# Patient Record
Sex: Male | Born: 1940 | Race: White | Hispanic: No | Marital: Single | Smoking: Former smoker
Health system: Southern US, Community
[De-identification: ages and names within clinical notes are randomized; demographics above are authoritative.]

## PROBLEM LIST (undated history)

## (undated) DIAGNOSIS — I10 Essential (primary) hypertension: Secondary | ICD-10-CM

---

## 2012-09-16 ENCOUNTER — Encounter: Payer: Self-pay | Admitting: Cardiology

## 2018-01-29 DIAGNOSIS — E782 Mixed hyperlipidemia: Secondary | ICD-10-CM | POA: Diagnosis not present

## 2018-01-29 DIAGNOSIS — I10 Essential (primary) hypertension: Secondary | ICD-10-CM | POA: Diagnosis not present

## 2018-01-29 DIAGNOSIS — N400 Enlarged prostate without lower urinary tract symptoms: Secondary | ICD-10-CM | POA: Diagnosis not present

## 2018-04-30 DIAGNOSIS — E7849 Other hyperlipidemia: Secondary | ICD-10-CM | POA: Diagnosis not present

## 2018-04-30 DIAGNOSIS — N400 Enlarged prostate without lower urinary tract symptoms: Secondary | ICD-10-CM | POA: Diagnosis not present

## 2018-04-30 DIAGNOSIS — I10 Essential (primary) hypertension: Secondary | ICD-10-CM | POA: Diagnosis not present

## 2018-07-31 DIAGNOSIS — Z1389 Encounter for screening for other disorder: Secondary | ICD-10-CM | POA: Diagnosis not present

## 2018-07-31 DIAGNOSIS — E7849 Other hyperlipidemia: Secondary | ICD-10-CM | POA: Diagnosis not present

## 2018-07-31 DIAGNOSIS — I10 Essential (primary) hypertension: Secondary | ICD-10-CM | POA: Diagnosis not present

## 2018-07-31 DIAGNOSIS — N400 Enlarged prostate without lower urinary tract symptoms: Secondary | ICD-10-CM | POA: Diagnosis not present

## 2018-07-31 DIAGNOSIS — Z0000 Encounter for general adult medical examination without abnormal findings: Secondary | ICD-10-CM | POA: Diagnosis not present

## 2019-01-22 DIAGNOSIS — E7849 Other hyperlipidemia: Secondary | ICD-10-CM | POA: Diagnosis not present

## 2019-01-22 DIAGNOSIS — Z681 Body mass index (BMI) 19 or less, adult: Secondary | ICD-10-CM | POA: Diagnosis not present

## 2019-01-22 DIAGNOSIS — N400 Enlarged prostate without lower urinary tract symptoms: Secondary | ICD-10-CM | POA: Diagnosis not present

## 2019-01-22 DIAGNOSIS — J449 Chronic obstructive pulmonary disease, unspecified: Secondary | ICD-10-CM | POA: Diagnosis not present

## 2019-01-22 DIAGNOSIS — I10 Essential (primary) hypertension: Secondary | ICD-10-CM | POA: Diagnosis not present

## 2019-06-01 DIAGNOSIS — I10 Essential (primary) hypertension: Secondary | ICD-10-CM | POA: Diagnosis not present

## 2019-06-01 DIAGNOSIS — Z681 Body mass index (BMI) 19 or less, adult: Secondary | ICD-10-CM | POA: Diagnosis not present

## 2019-06-01 DIAGNOSIS — N400 Enlarged prostate without lower urinary tract symptoms: Secondary | ICD-10-CM | POA: Diagnosis not present

## 2019-06-01 DIAGNOSIS — E7849 Other hyperlipidemia: Secondary | ICD-10-CM | POA: Diagnosis not present

## 2019-06-01 DIAGNOSIS — J449 Chronic obstructive pulmonary disease, unspecified: Secondary | ICD-10-CM | POA: Diagnosis not present

## 2019-08-28 ENCOUNTER — Inpatient Hospital Stay (HOSPITAL_COMMUNITY)
Admission: EM | Admit: 2019-08-28 | Discharge: 2019-09-03 | DRG: 242 | Disposition: A | Discharge status: Home / Self Care | Payer: Medicare HMO | Attending: Internal Medicine | Admitting: Internal Medicine

## 2019-08-28 DIAGNOSIS — R21 Rash and other nonspecific skin eruption: Secondary | ICD-10-CM | POA: Diagnosis present

## 2019-08-28 DIAGNOSIS — Z515 Encounter for palliative care: Secondary | ICD-10-CM | POA: Diagnosis present

## 2019-08-28 DIAGNOSIS — I350 Nonrheumatic aortic (valve) stenosis: Secondary | ICD-10-CM | POA: Diagnosis present

## 2019-08-28 DIAGNOSIS — N320 Bladder-neck obstruction: Secondary | ICD-10-CM | POA: Diagnosis present

## 2019-08-28 DIAGNOSIS — I10 Essential (primary) hypertension: Secondary | ICD-10-CM

## 2019-08-28 DIAGNOSIS — D649 Anemia, unspecified: Secondary | ICD-10-CM

## 2019-08-28 DIAGNOSIS — J80 Acute respiratory distress syndrome: Secondary | ICD-10-CM | POA: Diagnosis not present

## 2019-08-28 DIAGNOSIS — Z950 Presence of cardiac pacemaker: Secondary | ICD-10-CM

## 2019-08-28 DIAGNOSIS — I739 Peripheral vascular disease, unspecified: Secondary | ICD-10-CM | POA: Diagnosis present

## 2019-08-28 DIAGNOSIS — N400 Enlarged prostate without lower urinary tract symptoms: Secondary | ICD-10-CM | POA: Diagnosis present

## 2019-08-28 DIAGNOSIS — J939 Pneumothorax, unspecified: Secondary | ICD-10-CM

## 2019-08-28 DIAGNOSIS — I5031 Acute diastolic (congestive) heart failure: Secondary | ICD-10-CM

## 2019-08-28 DIAGNOSIS — R1310 Dysphagia, unspecified: Secondary | ICD-10-CM | POA: Diagnosis present

## 2019-08-28 DIAGNOSIS — R001 Bradycardia, unspecified: Secondary | ICD-10-CM | POA: Diagnosis present

## 2019-08-28 DIAGNOSIS — N289 Disorder of kidney and ureter, unspecified: Secondary | ICD-10-CM | POA: Diagnosis not present

## 2019-08-28 DIAGNOSIS — R7989 Other specified abnormal findings of blood chemistry: Secondary | ICD-10-CM

## 2019-08-28 DIAGNOSIS — R0689 Other abnormalities of breathing: Secondary | ICD-10-CM | POA: Diagnosis not present

## 2019-08-28 DIAGNOSIS — J449 Chronic obstructive pulmonary disease, unspecified: Secondary | ICD-10-CM | POA: Diagnosis present

## 2019-08-28 DIAGNOSIS — N19 Unspecified kidney failure: Secondary | ICD-10-CM

## 2019-08-28 DIAGNOSIS — I499 Cardiac arrhythmia, unspecified: Secondary | ICD-10-CM | POA: Diagnosis not present

## 2019-08-28 DIAGNOSIS — N179 Acute kidney failure, unspecified: Secondary | ICD-10-CM | POA: Diagnosis present

## 2019-08-28 DIAGNOSIS — R55 Syncope and collapse: Secondary | ICD-10-CM | POA: Diagnosis not present

## 2019-08-28 DIAGNOSIS — R069 Unspecified abnormalities of breathing: Secondary | ICD-10-CM | POA: Diagnosis not present

## 2019-08-28 DIAGNOSIS — R569 Unspecified convulsions: Secondary | ICD-10-CM | POA: Diagnosis present

## 2019-08-28 DIAGNOSIS — E785 Hyperlipidemia, unspecified: Secondary | ICD-10-CM | POA: Diagnosis present

## 2019-08-28 DIAGNOSIS — R778 Other specified abnormalities of plasma proteins: Secondary | ICD-10-CM

## 2019-08-28 DIAGNOSIS — J9601 Acute respiratory failure with hypoxia: Secondary | ICD-10-CM

## 2019-08-28 DIAGNOSIS — Z681 Body mass index (BMI) 19 or less, adult: Secondary | ICD-10-CM

## 2019-08-28 DIAGNOSIS — Z8249 Family history of ischemic heart disease and other diseases of the circulatory system: Secondary | ICD-10-CM

## 2019-08-28 DIAGNOSIS — I110 Hypertensive heart disease with heart failure: Secondary | ICD-10-CM | POA: Diagnosis present

## 2019-08-28 DIAGNOSIS — F17210 Nicotine dependence, cigarettes, uncomplicated: Secondary | ICD-10-CM | POA: Diagnosis present

## 2019-08-28 DIAGNOSIS — B029 Zoster without complications: Secondary | ICD-10-CM | POA: Diagnosis present

## 2019-08-28 DIAGNOSIS — I4510 Unspecified right bundle-branch block: Secondary | ICD-10-CM | POA: Diagnosis present

## 2019-08-28 DIAGNOSIS — Z79899 Other long term (current) drug therapy: Secondary | ICD-10-CM

## 2019-08-28 DIAGNOSIS — N281 Cyst of kidney, acquired: Secondary | ICD-10-CM | POA: Diagnosis present

## 2019-08-28 DIAGNOSIS — E43 Unspecified severe protein-calorie malnutrition: Secondary | ICD-10-CM | POA: Diagnosis present

## 2019-08-28 DIAGNOSIS — I442 Atrioventricular block, complete: Secondary | ICD-10-CM

## 2019-08-28 DIAGNOSIS — R0600 Dyspnea, unspecified: Secondary | ICD-10-CM | POA: Diagnosis not present

## 2019-08-28 DIAGNOSIS — Z20828 Contact with and (suspected) exposure to other viral communicable diseases: Secondary | ICD-10-CM | POA: Diagnosis present

## 2019-08-28 DIAGNOSIS — I509 Heart failure, unspecified: Secondary | ICD-10-CM

## 2019-08-28 DIAGNOSIS — D72829 Elevated white blood cell count, unspecified: Secondary | ICD-10-CM | POA: Diagnosis present

## 2019-08-28 DIAGNOSIS — I248 Other forms of acute ischemic heart disease: Secondary | ICD-10-CM | POA: Diagnosis present

## 2019-08-28 DIAGNOSIS — I252 Old myocardial infarction: Secondary | ICD-10-CM

## 2019-08-28 DIAGNOSIS — I471 Supraventricular tachycardia: Secondary | ICD-10-CM | POA: Diagnosis present

## 2019-08-28 HISTORY — DX: Essential (primary) hypertension: I10

## 2019-08-28 NOTE — ED Triage Notes (Addendum)
Pt BIB EMS from Home SOB for past week and has been recently more SOB. Upon arrival EMS did EKG and saw possible 3rd Degree Heart Block.   VSS with EMS except HR in 40s.   Currently SOB with NRB 15L (for comfort per EMS)  Pt states no cardiac Hx but Metoprolol and other cardiac medications were found at bedside.

## 2019-08-28 NOTE — ED Notes (Signed)
ED Provider notified of patients condition.

## 2019-08-29 ENCOUNTER — Inpatient Hospital Stay (HOSPITAL_COMMUNITY): Payer: Medicare HMO

## 2019-08-29 ENCOUNTER — Emergency Department (HOSPITAL_COMMUNITY): Payer: Medicare HMO

## 2019-08-29 ENCOUNTER — Encounter (HOSPITAL_COMMUNITY)
Admission: EM | Disposition: A | Discharge status: Home / Self Care | Payer: Self-pay | Source: Home / Self Care | Attending: Internal Medicine

## 2019-08-29 DIAGNOSIS — J9601 Acute respiratory failure with hypoxia: Secondary | ICD-10-CM

## 2019-08-29 DIAGNOSIS — N179 Acute kidney failure, unspecified: Secondary | ICD-10-CM | POA: Diagnosis not present

## 2019-08-29 DIAGNOSIS — Z681 Body mass index (BMI) 19 or less, adult: Secondary | ICD-10-CM | POA: Diagnosis not present

## 2019-08-29 DIAGNOSIS — B029 Zoster without complications: Secondary | ICD-10-CM | POA: Diagnosis present

## 2019-08-29 DIAGNOSIS — R569 Unspecified convulsions: Secondary | ICD-10-CM

## 2019-08-29 DIAGNOSIS — R001 Bradycardia, unspecified: Secondary | ICD-10-CM | POA: Diagnosis present

## 2019-08-29 DIAGNOSIS — R1310 Dysphagia, unspecified: Secondary | ICD-10-CM | POA: Diagnosis present

## 2019-08-29 DIAGNOSIS — R55 Syncope and collapse: Secondary | ICD-10-CM | POA: Diagnosis not present

## 2019-08-29 DIAGNOSIS — I442 Atrioventricular block, complete: Secondary | ICD-10-CM | POA: Diagnosis not present

## 2019-08-29 DIAGNOSIS — R778 Other specified abnormalities of plasma proteins: Secondary | ICD-10-CM | POA: Diagnosis not present

## 2019-08-29 DIAGNOSIS — E43 Unspecified severe protein-calorie malnutrition: Secondary | ICD-10-CM | POA: Diagnosis not present

## 2019-08-29 DIAGNOSIS — I350 Nonrheumatic aortic (valve) stenosis: Secondary | ICD-10-CM | POA: Diagnosis present

## 2019-08-29 DIAGNOSIS — Z515 Encounter for palliative care: Secondary | ICD-10-CM | POA: Diagnosis not present

## 2019-08-29 DIAGNOSIS — I10 Essential (primary) hypertension: Secondary | ICD-10-CM | POA: Diagnosis not present

## 2019-08-29 DIAGNOSIS — I509 Heart failure, unspecified: Secondary | ICD-10-CM | POA: Diagnosis not present

## 2019-08-29 DIAGNOSIS — R9431 Abnormal electrocardiogram [ECG] [EKG]: Secondary | ICD-10-CM | POA: Diagnosis not present

## 2019-08-29 DIAGNOSIS — Z7189 Other specified counseling: Secondary | ICD-10-CM | POA: Diagnosis not present

## 2019-08-29 DIAGNOSIS — F17210 Nicotine dependence, cigarettes, uncomplicated: Secondary | ICD-10-CM | POA: Diagnosis present

## 2019-08-29 DIAGNOSIS — R7989 Other specified abnormal findings of blood chemistry: Secondary | ICD-10-CM | POA: Diagnosis not present

## 2019-08-29 DIAGNOSIS — N281 Cyst of kidney, acquired: Secondary | ICD-10-CM | POA: Diagnosis not present

## 2019-08-29 DIAGNOSIS — D649 Anemia, unspecified: Secondary | ICD-10-CM

## 2019-08-29 DIAGNOSIS — I110 Hypertensive heart disease with heart failure: Secondary | ICD-10-CM | POA: Diagnosis present

## 2019-08-29 DIAGNOSIS — J90 Pleural effusion, not elsewhere classified: Secondary | ICD-10-CM | POA: Diagnosis not present

## 2019-08-29 DIAGNOSIS — J449 Chronic obstructive pulmonary disease, unspecified: Secondary | ICD-10-CM | POA: Diagnosis not present

## 2019-08-29 DIAGNOSIS — Z20828 Contact with and (suspected) exposure to other viral communicable diseases: Secondary | ICD-10-CM | POA: Diagnosis not present

## 2019-08-29 DIAGNOSIS — Z950 Presence of cardiac pacemaker: Secondary | ICD-10-CM | POA: Diagnosis not present

## 2019-08-29 DIAGNOSIS — I5031 Acute diastolic (congestive) heart failure: Secondary | ICD-10-CM | POA: Diagnosis not present

## 2019-08-29 DIAGNOSIS — I739 Peripheral vascular disease, unspecified: Secondary | ICD-10-CM | POA: Diagnosis not present

## 2019-08-29 DIAGNOSIS — I248 Other forms of acute ischemic heart disease: Secondary | ICD-10-CM | POA: Diagnosis not present

## 2019-08-29 DIAGNOSIS — E785 Hyperlipidemia, unspecified: Secondary | ICD-10-CM | POA: Diagnosis present

## 2019-08-29 DIAGNOSIS — N289 Disorder of kidney and ureter, unspecified: Secondary | ICD-10-CM | POA: Diagnosis not present

## 2019-08-29 DIAGNOSIS — I4510 Unspecified right bundle-branch block: Secondary | ICD-10-CM | POA: Diagnosis present

## 2019-08-29 DIAGNOSIS — R0602 Shortness of breath: Secondary | ICD-10-CM | POA: Diagnosis not present

## 2019-08-29 DIAGNOSIS — D72829 Elevated white blood cell count, unspecified: Secondary | ICD-10-CM | POA: Diagnosis present

## 2019-08-29 HISTORY — PX: TEMPORARY PACEMAKER: CATH118268

## 2019-08-29 LAB — ECHOCARDIOGRAM COMPLETE
Height: 71 in
Weight: 1964.74 oz

## 2019-08-29 LAB — CBC WITH DIFFERENTIAL/PLATELET
Abs Immature Granulocytes: 0.03 10*3/uL (ref 0.00–0.07)
Basophils Absolute: 0 10*3/uL (ref 0.0–0.1)
Basophils Relative: 0 %
Eosinophils Absolute: 0 10*3/uL (ref 0.0–0.5)
Eosinophils Relative: 0 %
HCT: 38.7 % — ABNORMAL LOW (ref 39.0–52.0)
Hemoglobin: 12.5 g/dL — ABNORMAL LOW (ref 13.0–17.0)
Immature Granulocytes: 0 %
Lymphocytes Relative: 5 %
Lymphs Abs: 0.4 10*3/uL — ABNORMAL LOW (ref 0.7–4.0)
MCH: 30.7 pg (ref 26.0–34.0)
MCHC: 32.3 g/dL (ref 30.0–36.0)
MCV: 95.1 fL (ref 80.0–100.0)
Monocytes Absolute: 0.4 10*3/uL (ref 0.1–1.0)
Monocytes Relative: 4 %
Neutro Abs: 7.8 10*3/uL — ABNORMAL HIGH (ref 1.7–7.7)
Neutrophils Relative %: 91 %
Platelets: 200 10*3/uL (ref 150–400)
RBC: 4.07 MIL/uL — ABNORMAL LOW (ref 4.22–5.81)
RDW: 14.4 % (ref 11.5–15.5)
WBC: 8.6 10*3/uL (ref 4.0–10.5)
nRBC: 0 % (ref 0.0–0.2)

## 2019-08-29 LAB — TROPONIN I (HIGH SENSITIVITY)
Troponin I (High Sensitivity): 869 ng/L (ref <18)
Troponin I (High Sensitivity): 778 ng/L (ref <18)
Troponin I (High Sensitivity): 1306 ng/L (ref <18)
Troponin I (High Sensitivity): 1583 ng/L (ref <18)

## 2019-08-29 LAB — BASIC METABOLIC PANEL
Anion gap: 12 (ref 5–15)
BUN: 45 mg/dL — ABNORMAL HIGH (ref 8–23)
CO2: 19 mmol/L — ABNORMAL LOW (ref 22–32)
Calcium: 8.9 mg/dL (ref 8.9–10.3)
Chloride: 104 mmol/L (ref 98–111)
Creatinine, Ser: 2.82 mg/dL — ABNORMAL HIGH (ref 0.61–1.24)
GFR calc Af Amer: 24 mL/min — ABNORMAL LOW (ref >60)
GFR calc non Af Amer: 20 mL/min — ABNORMAL LOW (ref >60)
Glucose, Bld: 145 mg/dL — ABNORMAL HIGH (ref 70–99)
Potassium: 4.8 mmol/L (ref 3.5–5.1)
Sodium: 135 mmol/L (ref 135–145)

## 2019-08-29 LAB — BRAIN NATRIURETIC PEPTIDE: B Natriuretic Peptide: 1035.9 pg/mL — ABNORMAL HIGH (ref 0.0–100.0)

## 2019-08-29 LAB — MAGNESIUM: Magnesium: 1.9 mg/dL (ref 1.7–2.4)

## 2019-08-29 LAB — POCT I-STAT 7, (LYTES, BLD GAS, ICA,H+H)
Acid-base deficit: 7 mmol/L — ABNORMAL HIGH (ref 0.0–2.0)
Bicarbonate: 17.3 mmol/L — ABNORMAL LOW (ref 20.0–28.0)
Calcium, Ion: 1.15 mmol/L (ref 1.15–1.40)
HCT: 33 % — ABNORMAL LOW (ref 39.0–52.0)
Hemoglobin: 11.2 g/dL — ABNORMAL LOW (ref 13.0–17.0)
O2 Saturation: 100 %
Patient temperature: 98.1
Potassium: 5 mmol/L (ref 3.5–5.1)
Sodium: 137 mmol/L (ref 135–145)
TCO2: 18 mmol/L — ABNORMAL LOW (ref 22–32)
pCO2 arterial: 29.3 mmHg — ABNORMAL LOW (ref 32.0–48.0)
pH, Arterial: 7.376 (ref 7.350–7.450)
pO2, Arterial: 236 mmHg — ABNORMAL HIGH (ref 83.0–108.0)

## 2019-08-29 LAB — SARS CORONAVIRUS 2 (TAT 6-24 HRS): SARS Coronavirus 2: NEGATIVE

## 2019-08-29 SURGERY — TEMPORARY PACEMAKER
Anesthesia: LOCAL

## 2019-08-29 MED ORDER — ATROPINE SULFATE 1 MG/10ML IJ SOSY
1.0000 mg | PREFILLED_SYRINGE | Freq: Once | INTRAMUSCULAR | Status: AC
  Administered 2019-08-29: 1 mg via INTRAVENOUS
  Filled 2019-08-29: qty 10

## 2019-08-29 MED ORDER — ENOXAPARIN SODIUM 60 MG/0.6ML ~~LOC~~ SOLN
1.0000 mg/kg | Freq: Once | SUBCUTANEOUS | Status: AC
  Administered 2019-08-29: 55 mg via SUBCUTANEOUS
  Filled 2019-08-29: qty 0.6

## 2019-08-29 MED ORDER — NON FORMULARY: 10.0000 mg | Freq: Every evening | Status: DC | PRN

## 2019-08-29 MED ORDER — SODIUM CHLORIDE 0.9 % IV SOLN
250.0000 mL | INTRAVENOUS | Status: DC | PRN
  Administered 2019-08-29: 250 mL via INTRAVENOUS

## 2019-08-29 MED ORDER — SODIUM CHLORIDE 0.9% FLUSH
3.0000 mL | Freq: Two times a day (BID) | INTRAVENOUS | Status: DC
  Administered 2019-08-29 (×2): 3 mL via INTRAVENOUS

## 2019-08-29 MED ORDER — MELATONIN 3 MG PO TABS
9.0000 mg | ORAL_TABLET | Freq: Every evening | ORAL | Status: DC | PRN
  Administered 2019-08-29: 9 mg via ORAL
  Filled 2019-08-29 (×3): qty 3

## 2019-08-29 MED ORDER — SODIUM CHLORIDE 0.9 % IV SOLN
1.0000 g | INTRAVENOUS | Status: DC
  Administered 2019-08-29 – 2019-08-30 (×2): 1 g via INTRAVENOUS
  Filled 2019-08-29 (×2): qty 1

## 2019-08-29 MED ORDER — SODIUM CHLORIDE 0.9% FLUSH: 3.0000 mL | INTRAVENOUS | Status: DC | PRN

## 2019-08-29 MED ORDER — SODIUM CHLORIDE 0.9 % IV SOLN
250.0000 mL | INTRAVENOUS | Status: DC | PRN
  Administered 2019-08-30: 250 mL via INTRAVENOUS

## 2019-08-29 MED ORDER — HEPARIN (PORCINE) IN NACL 1000-0.9 UT/500ML-% IV SOLN
INTRAVENOUS | Status: DC | PRN
  Administered 2019-08-29: 500 mL

## 2019-08-29 MED ORDER — SODIUM CHLORIDE 0.9% FLUSH
3.0000 mL | Freq: Two times a day (BID) | INTRAVENOUS | Status: DC
  Administered 2019-08-29 – 2019-09-03 (×8): 3 mL via INTRAVENOUS

## 2019-08-29 MED ORDER — CHLORHEXIDINE GLUCONATE CLOTH 2 % EX PADS
6.0000 | MEDICATED_PAD | Freq: Every day | CUTANEOUS | Status: DC
  Administered 2019-08-30 – 2019-09-03 (×5): 6 via TOPICAL

## 2019-08-29 MED ORDER — FUROSEMIDE 10 MG/ML IJ SOLN
20.0000 mg | Freq: Once | INTRAMUSCULAR | Status: AC
  Administered 2019-08-29: 20 mg via INTRAVENOUS
  Filled 2019-08-29: qty 2

## 2019-08-29 MED ORDER — LEVETIRACETAM IN NACL 500 MG/100ML IV SOLN
500.0000 mg | Freq: Two times a day (BID) | INTRAVENOUS | Status: DC
  Administered 2019-08-29 – 2019-08-31 (×4): 500 mg via INTRAVENOUS
  Filled 2019-08-29 (×4): qty 100

## 2019-08-29 MED ORDER — LIDOCAINE HCL (PF) 1 % IJ SOLN
INTRAMUSCULAR | Status: DC | PRN
  Administered 2019-08-29: 15 mL

## 2019-08-29 MED ORDER — HEPARIN (PORCINE) IN NACL 1000-0.9 UT/500ML-% IV SOLN
INTRAVENOUS | Status: AC
  Filled 2019-08-29: qty 500

## 2019-08-29 MED ORDER — SODIUM CHLORIDE 0.9 % IV SOLN
INTRAVENOUS | Status: AC | PRN
  Administered 2019-08-29: 10 mL/h via INTRAVENOUS

## 2019-08-29 MED ORDER — LIDOCAINE HCL (PF) 1 % IJ SOLN
INTRAMUSCULAR | Status: AC
  Filled 2019-08-29: qty 30

## 2019-08-29 SURGICAL SUPPLY — 8 items
CABLE ADAPT PACING TEMP 12FT (ADAPTER) ×2 IMPLANT
CATH S G BIP PACING (CATHETERS) ×2 IMPLANT
PACK CARDIAC CATHETERIZATION (CUSTOM PROCEDURE TRAY) ×2 IMPLANT
PINNACLE LONG 6F 25CM (SHEATH) ×2
SHEATH INTRO PINNACLE 6F 25CM (SHEATH) ×1 IMPLANT
SHEATH PINNACLE 6F 10CM (SHEATH) ×2 IMPLANT
SLEEVE REPOSITIONING LENGTH 30 (MISCELLANEOUS) ×2 IMPLANT
WIRE EMERALD 3MM-J .035X150CM (WIRE) ×2 IMPLANT

## 2019-08-29 NOTE — Progress Notes (Signed)
Home Meds per spouse: Flomax 0.4 mg qday Atorvastatin 20 mg q hs  Amlodipine 5 mg qday Metoprolol Tartrate 12.5 mg q am  Vit D3 1000 iu daily

## 2019-08-29 NOTE — Progress Notes (Signed)
Orthopedic Tech Progress Note Patient Details:  Kenneth Matthews [birth year] [id number]  Ortho Devices Type of Ortho Device: Knee Immobilizer Ortho Device/Splint Location: right Ortho Device/Splint Interventions: Application   Post Interventions Patient Tolerated: Well Instructions Provided: Care of device   Kenneth Matthews 08/29/2019, 12:51 PM

## 2019-08-29 NOTE — ED Notes (Signed)
Provider notified of elevated troponin.  

## 2019-08-29 NOTE — ED Provider Notes (Signed)
[facility] EMERGENCY DEPARTMENT Provider Note   CSN: [id number] Arrival date & time: 08/28/19  2312    History   Chief Complaint Chief Complaint  Patient presents with  . Heart Block    HPI Kenneth Matthews is a 78 y.o. male.   The history is provided by the patient and the EMS personnel.  He comes in because of shortness of breath and generalized weakness for the last week.  Dyspnea seems to be worse when he is supine, but also with any exertion.  He denies chest pain, heaviness, tightness, pressure.  He denies nausea or vomiting.  He denies palpitations.  EMS noted third-degree heart block.  On review of his medications, he is on metoprolol and amlodipine, but relatively low doses of both of those.  No past medical history on file.  There are no active problems to display for this patient.   ** The histories are not reviewed yet. Please review them in the "History" navigator section and refresh this [place].      Home Medications    Prior to Admission medications   Not on File    Family History No family history on file.  Social History Social History   Tobacco Use  . Smoking status: Not on file  Substance Use Topics  . Alcohol use: Not on file  . Drug use: Not on file     Allergies   Patient has no allergy information on record.   Review of Systems Review of Systems  All other systems reviewed and are negative.    Physical Exam Updated Vital Signs BP (!) 124/47   Pulse (!) 137   Temp 98.1 F (36.7 C) (Oral)   Resp (!) 39   SpO2 100%   Physical Exam Vitals signs and nursing note reviewed.    78 year old male, appears mildly dyspneic at rest, but is in no acute distress. Vital signs are significant for slow heart rate and elevated respiratory rate. Oxygen saturation is 100%, which is normal. Head is normocephalic and atraumatic. PERRLA, EOMI. Oropharynx is clear. Neck is nontender and supple without adenopathy or  JVD. Back is nontender and there is no CVA tenderness. Lungs have few rales at the left base without wheezes or rhonchi. Chest is nontender. Heart has regular rate and rhythm without murmur. Abdomen is soft, flat, nontender without masses or hepatosplenomegaly and peristalsis is normoactive. Extremities have no cyanosis or edema, full range of motion is present. Skin is warm and dry without rash. Neurologic: Mental status is normal, cranial nerves are intact, there are no motor or sensory deficits.  ED Treatments / Results  Labs (all labs ordered are listed, but only abnormal results are displayed) Labs Reviewed  SARS CORONAVIRUS 2 (TAT 6-24 HRS)  BASIC METABOLIC PANEL  BRAIN NATRIURETIC PEPTIDE  CBC WITH DIFFERENTIAL/PLATELET  MAGNESIUM  TROPONIN I (HIGH SENSITIVITY)    EKG Third-degree AV block with ventricular rate of 44.  Right bundle branch block.  Left ventricular hypertrophy.  ST and T changes possibly secondary to LVH.  No prior ECG available for comparison.  Radiology Dg Chest Port 1 View  Result Date: 08/29/2019 CLINICAL DATA:  Shortness of breath for 1 week EXAM: PORTABLE CHEST 1 VIEW COMPARISON:  None. FINDINGS: Cardiac shadow is at the upper limits of normal in size. Aortic calcifications are noted. Calcified pleural plaques are noted. Small right pleural effusion is seen. Right basilar atelectatic changes are noted. No bony abnormality is seen. IMPRESSION: Right  basilar atelectasis with small effusion. Bilateral calcified pleural plaques. Electronically Signed   By: Alcide Clever M.D.   On: 08/29/2019 00:21    Procedures Procedures  CRITICAL CARE Performed by: Dione Booze Total critical care time: 50 minutes Critical care time was exclusive of separately billable procedures and treating other patients. Critical care was necessary to treat or prevent imminent or life-threatening deterioration. Critical care was time spent personally by me on the following  activities: development of treatment plan with patient and/or surrogate as well as nursing, discussions with consultants, evaluation of patient's response to treatment, examination of patient, obtaining history from patient or surrogate, ordering and performing treatments and interventions, ordering and review of laboratory studies, ordering and review of radiographic studies, pulse oximetry and re-evaluation of patient's condition.  Medications Ordered in ED Medications  atropine 1 MG/10ML injection 1 mg (has no administration in time range)     Initial Impression / Assessment and Plan / ED Course  I have reviewed the triage vital signs and the nursing notes.  Pertinent labs & imaging results that were available during my care of the patient were reviewed by me and considered in my medical decision making (see chart for details).  Dyspnea and weakness.  Monitor does show complete heart block with atrial rate of 115 and ventricular rate of 48.  While this might be from sick sinus syndrome, most likely cause is beta-blocker use with possible exacerbation secondary to calcium channel blocker use.  He is maintaining adequate blood pressures.  He will be given a dose of atropine.  He will need to be admitted and kept on cardiac monitor well amlodipine and metoprolol clear his system.  If he does not improve with that, he may need a pacemaker.  Old records are reviewed, and he has no relevant past visits.  There was no change in heart rhythm following atropine.  Twelve-lead ECG shows third-degree AV block with ventricular rate of 44, right bundle branch block, T wave inversions in the anteroseptal leads with no prior ECG available for comparison.  Labs show renal insufficiency, anemia which is probably related to renal failure, elevated BNP and elevated troponin which are at least partly related to renal failure.  With no chest pain and no ECG findings, I do not feel that anticoagulation is indicated.  No  prior labs are available for comparison.  Chest x-ray does not show any obvious signs of heart failure.  Case is discussed with Dr. Nelda Bucks of [facility], who agrees to admit the patient.  Final Clinical Impressions(s) / ED Diagnoses   Final diagnoses:  Third degree heart block (HCC)  Renal insufficiency  Normochromic normocytic anemia  Elevated troponin I level  Elevated brain natriuretic peptide (BNP) level    ED Discharge Orders    None       Dione Booze, MD 08/29/19 [id number]

## 2019-08-29 NOTE — Procedures (Signed)
Patient Name: Kenneth Matthews  MRN: [id number]  Epilepsy Attending: Lora Havens  Referring Physician/Provider: Dr Karena Addison Aroor Date: 08/29/2019 Duration: 23.05 mins  Patient history: 78yo M with seizure vs syncope. EEG to evaluate for seizure  Level of alertness: awake  AEDs during EEG study: LEV  Technical aspects: This EEG study was done with scalp electrodes positioned according to the 10-20 International system of electrode placement. Electrical activity was acquired at a sampling rate of 500Hz  and reviewed with a high frequency filter of 70Hz  and a low frequency filter of 1Hz . EEG data were recorded continuously and digitally stored.   DESCRIPTION:  The posterior dominant rhythm consists of 8-9 Hz activity of moderate voltage (25-35 uV) seen predominantly in posterior head regions, symmetric and reactive to eye opening and eye closing. Hyperventilation and photic stimulation were not performed.  IMPRESSION: This study is within normal limits. No seizures or epileptiform discharges were seen throughout the recording.  [name] Barbra Sarks

## 2019-08-29 NOTE — Progress Notes (Signed)
Pt in CHB with HR in 40's, but more slower beats down to 26 bpm. BP stable. Dr Marlou Porch into room. Plan to insert temp pacer in cath lab per Dr Marlou Porch. 2H bed requested. Pt's spouse notified of plan. Verbalized understanding.

## 2019-08-29 NOTE — Consult Note (Addendum)
NEURO HOSPITALIST CONSULT NOTE   Requesting physician: Dr. Kurtis Bushman   Reason for Consult:seizure   History obtained from:  Patient  / family   HPI:                                                                                                                                          Kenneth Matthews is an 78 y.o. male  With [place]  MI N-STEMI ( 2013), COPD, HTN, PVD who presented [facility] ED on 08/28/2019 c/o SOB and generalized weakness for about 1 week. Neurology consulted 08/29/2019 seizure.    Patient c/o generalized weakness and SOB. Ems noted patient was in 3rd degree heart block. Patient was noted to have several seizure like activity on the way to pacemaker procedure and in the cath lab. Per family member patient has had at least 2 episodes of seizure like activity prior to hospitalization  She states that his eyes roll to the back.  His body stiffens for about 8- 10 seconds and then he returns to normal. These episodes happened today prior to pacemaker and a few times during pacemaker procedure. Has never been on seizure medication or had an EEG before.     Fam hx  heart disease ( mother)  Social hx Current smoker ( 1/2 pack per day) No ETOH Dugs: no  No Known Allergies  MEDICATIONS:                                                                                                                     Scheduled: . Chlorhexidine Gluconate Cloth  6 each Topical Daily  . sodium chloride flush  3 mL Intravenous Q12H  . sodium chloride flush  3 mL Intravenous Q12H   Continuous: . sodium chloride    . sodium chloride    . cefTRIAXone (ROCEPHIN)  IV Stopped (08/29/19 0905)   LOV:FIEPPI chloride, sodium chloride, sodium chloride flush, sodium chloride flush   ROS:  ROS was performed and is negative except as noted in  HPI    Blood pressure (!) 158/66, pulse (!) 0, temperature 97.6 F (36.4 C), temperature source Oral, resp. rate (!) 46, height 5\' 11"  (1.803 m), weight 55.7 kg, SpO2 94 %.   General Examination:                                                                                                       Physical Exam  Constitutional: Appears well-developed and well-nourished.  Psych: Affect appropriate to situation Eyes: Normal external eye and conjunctiva. HENT: Normocephalic, no lesions, without obvious abnormality.   Musculoskeletal-no joint tenderness, deformity or swelling Cardiovascular: Normal rate and regular rhythm.  Respiratory: Effort normal, non-labored breathing saturations WNL GI: Soft.  No distension. There is no tenderness.  Skin: WDI  Neurological Examination Mental Status: Alert, oriented, thought content appropriate.  Speech fluent without evidence of aphasia.  Able to follow commands without difficulty. Cranial Nerves: II: Visual fields grossly normal,  III,IV, VI: ptosis not present, extra-ocular motions intact bilaterally pupils equal, round, reactive to light and accommodation V,VII: smile symmetric, facial light touch sensation normal bilaterally VIII: hearing normal bilaterally IX,X: uvula rises symmetrically XI: bilateral shoulder shrug XII: midline tongue extension Motor: Right : Upper extremity   5/5  Left:     Upper extremity   5/5  Lower extremity   5/5   Lower extremity   5/5 Tone and bulk:normal tone throughout; no atrophy noted Sensory: light touch intact throughout, bilaterally Deep Tendon Reflexes: 2+ in left patella, right leg in long leg brace. 1+ BUE Plantars: Right: downgoing   Left: downgoing Gait: deferred   Lab Results: Basic Metabolic Panel: Recent Labs  Lab 08/28/19 2317 08/29/19 0433  NA 135 137  K 4.8 5.0  CL 104  --   CO2 19*  --   GLUCOSE 145*  --   BUN 45*  --   CREATININE 2.82*  --   CALCIUM 8.9  --   MG 1.9  --      CBC: Recent Labs  Lab 08/28/19 2317 08/29/19 0433  WBC 8.6  --   NEUTROABS 7.8*  --   HGB 12.5* 11.2*  HCT 38.7* 33.0*  MCV 95.1  --   PLT 200  --    Imaging: Dg Chest Port 1 View  Result Date: 08/29/2019 CLINICAL DATA:  Shortness of breath for 1 week EXAM: PORTABLE CHEST 1 VIEW COMPARISON:  None. FINDINGS: Cardiac shadow is at the upper limits of normal in size. Aortic calcifications are noted. Calcified pleural plaques are noted. Small right pleural effusion is seen. Right basilar atelectatic changes are noted. No bony abnormality is seen. IMPRESSION: Right basilar atelectasis with small effusion. Bilateral calcified pleural plaques. Electronically Signed   By: Alcide CleverMark  Lukens M.D.   On: 08/29/2019 00:21    Assessment:  Kenneth Matthews is an 78 y.o. male  With PMH  MI N-STEMI ( 2013), COPD, HTN, PVD who [facility] c/o SOB and generalized weakness for about 1 week. Neurology consulted  08/29/2019 seizure. Impression: Seizure vs convulsive syncope  Recommendations: --CTH  --EEG -- keppra 500 mg BID, however d/c if seizure like spells occurred while patient having heart block.    Valentina Lucks, MSN, NP-C [facility] [phone]  Attending neurologist's note to follow   08/29/2019, 1:04 PM  NEUROHOSPITALIST ADDENDUM Performed a face to face diagnostic evaluation.   I have reviewed the contents of history and physical exam as documented by PA/ARNP/Resident and agree with above documentation.  I have discussed and formulated the above plan as documented. Edits to the note have been made as needed.   Spell described by patient's wife was sudden onset unresponsiveness with up rolling of eyes and posturing, jerking of upper extremities lasting for less than 10 seconds with a fairly short postictal period. EEG is unremarkable CT head is also unremarkable to explain focus for seizure.  A normal CT head and routine EEG does not rule out  seizures.  However given how brief episodes were convulsive syncope is highly in the differential especially in a patient with arrhythmia.  Would recommend patient not to drive for 6 months another seizure precautions including no swimming, climbing heights.  Was started on Keppra during this hospital stay however if we can determine if this seizure-like spells occurred in the setting of bradycardia, will discontinue.    Georgiana Spinner Aroor MD [facility] [phone]   If 7pm to 7am, please call on call as listed on AMION.

## 2019-08-29 NOTE — Progress Notes (Signed)
Transferred via pt bed with 100% NRB mask at 10 L. Report given to [facility], RN. Pt's wife contacted via telephone to obtain consent for Temp pacer wire. Updated wife on pt's new location.

## 2019-08-29 NOTE — Progress Notes (Addendum)
Called per floor RN at 0900 regarding Pt admitted in complete heart block per ED MD notes. Per floor RN Pt HR admitted with rate in 40s, and has now reduced to 30s with new PVCs.  Upon my arrival Pt heart monitor yielding rate of 26 however on monitor it appears to be a rate of about 40-45 complete heart block. BP 126/50 RR 18-20 95-100 % on NRB. Pt not in respiratory distress at this time. Lungs diminished with some crackles in bilat bases. Pt awake, follows commands, moans but denies pain states "Im just feeling stiff" oriented to name and location. [facility] MD Dr. Kurtis Bushman paged per floor RN Marya Amsler and a cardiology consult is in process. Dr. Marlou Porch Cardiology to see Patient soon. EKG done at bedside appears to still be in complete heart block, RBBB rate 40s. Dr. Marlou Porch at bedside [phone] to assess Patient. AC and Patient Placement called, bed request in progress.  Per MD to transfer to CCU for Temp Pacemaker today. Marya Amsler RN updated and awaiting bed on 2 H for transfer.

## 2019-08-29 NOTE — Progress Notes (Signed)
*  PRELIMINARY RESULTS* Echocardiogram 2D Echocardiogram has been performed.  Kenneth Matthews 08/29/2019, 4:39 PM

## 2019-08-29 NOTE — Consult Note (Addendum)
Cardiology Consultation:   Patient ID: [name] MRN: [id number]; DOB: [birth year]  Admit date: 08/28/2019 Date of Consult: 08/29/2019  Primary Care Provider: Neale Burly, MD Primary Cardiologist: No primary care provider on file. New Primary Electrophysiologist:  None New (Dr. Curt Bears)   Patient Profile:   Kenneth Matthews is a 78 y.o. male with a hx of hypertension who is being seen today for the evaluation of complete heart block, severe symptomatic bradycardia/AV block at the request of Dr Kurtis Bushman.  History of Present Illness:   Mr. Buer 78 year old male with essential hypertension here with symptomatic bradycardia, AV block/third-degree heart block.   He has been complaining of shortness of breath over the last week and was worse yesterday evening.  No fevers chills cough.  Covid negative.  Heart rate originally was in the lower 40s.  On telemetry over the past few hours he is demonstrated transient heart rates in the mid 20s.  BNP 1035, troponin 1583    Heart Pathway Score:     No past medical history on file.  No prior significant cardiac history  Non-smoker  Home Medications:  Prior to Admission medications   Medication Sig Start Date End Date Taking? Authorizing Provider  albuterol (VENTOLIN HFA) 108 (90 Base) MCG/ACT inhaler Inhale 2 puffs into the lungs every 4 (four) hours as needed for wheezing or shortness of breath. 08/06/19   [provider]  amLODipine (NORVASC) 5 MG tablet Take 5 mg by mouth daily. 08/24/19   [provider]  atorvastatin (LIPITOR) 20 MG tablet Take 20 mg by mouth at bedtime. 07/06/19   [provider]  metoprolol tartrate (LOPRESSOR) 25 MG tablet Take 12.5 mg by mouth every morning. 07/13/19   [provider]  tamsulosin (FLOMAX) 0.4 MG CAPS capsule Take 0.4 mg by mouth daily. 08/24/19   [provider]    Inpatient Medications: Scheduled Meds: . sodium chloride flush  3 mL Intravenous  Q12H   Continuous Infusions: . sodium chloride    . cefTRIAXone (ROCEPHIN)  IV 1 g (08/29/19 0829)   PRN Meds: sodium chloride, sodium chloride flush  Allergies:   No Known Allergies  Social History:   Social History   Socioeconomic History  . Marital status: Single    Spouse name: Not on file  . Number of children: Not on file  . Years of education: Not on file  . Highest education level: Not on file  Occupational History  . Not on file  Social Needs  . Financial resource strain: Not on file  . Food insecurity    Worry: Not on file    Inability: Not on file  . Transportation needs    Medical: Not on file    Non-medical: Not on file  Tobacco Use  . Smoking status: Not on file  Substance and Sexual Activity  . Alcohol use: Not on file  . Drug use: Not on file  . Sexual activity: Not on file  Lifestyle  . Physical activity    Days per week: Not on file    Minutes per session: Not on file  . Stress: Not on file  Relationships  . Social Herbalist on phone: Not on file    Gets together: Not on file    Attends religious service: Not on file    Active member of club or organization: Not on file    Attends meetings of clubs or organizations: Not on file    Relationship  status: Not on file  . Intimate partner violence    Fear of current or ex partner: Not on file    Emotionally abused: Not on file    Physically abused: Not on file    Forced sexual activity: Not on file  Other Topics Concern  . Not on file  Social History Narrative  . Not on file    Family History:   No family history on file.  No early family history of coronary disease  ROS:  Please see the history of present illness.  Does not complain of any chest pain.  Currently short of breath.  Denies any fevers or chills. All other ROS reviewed and negative.     Physical Exam/Data:   Vitals:   08/29/19 0915 08/29/19 0930 08/29/19 0945 08/29/19 1000  BP: (!) 126/50 (!) 142/44 (!) 106/45  (!) 148/41  Pulse: (!) 31 (!) 131 (!) 36 (!) 57  Resp: 20 16 17 20   Temp:      TempSrc:      SpO2: 98% 99% 98% 98%  Weight:      Height:       No intake or output data in the 24 hours ending 08/29/19 1024 Last 3 Weights 08/29/2019  Weight (lbs) 122 lb 12.7 oz  Weight (kg) 55.7 kg     Body mass index is 17.13 kg/m.  General: Thin, alert with facemask oxygen HEENT: normal Lymph: no adenopathy Neck: no JVD Endocrine:  No thryomegaly Vascular: No carotid bruits; FA pulses 2+ bilaterally without bruits  Cardiac:  normal S1, S2; RRR; 2/6 systolic murmur  Lungs:  clear to auscultation bilaterally, no wheezing, rhonchi or rales  Abd: soft, nontender, no hepatomegaly  Ext: no edema Musculoskeletal:  No deformities, BUE and BLE strength normal and equal Skin: warm and dry  Neuro:  CNs 2-12 intact, no focal abnormalities noted Psych:  Normal affect   EKG:  The EKG was personally reviewed and demonstrates: Underlying sinus tachycardia with escape rhythm, heart rate 44 bpm  Telemetry:  Telemetry was personally reviewed and demonstrates: Transient decrease to 26 bpm, wide-complex escape.  Underlying sinus rhythm/sinus tachycardia 100-110.  Rare PVC noted  Relevant CV Studies: Echocardiogram 2013 demonstrated mild aortic stenosis, EF 65%.  Laboratory Data:  High Sensitivity Troponin:   Recent Labs  Lab 08/28/19 2317 08/29/19 0200 08/29/19 0529 08/29/19 0711  TROPONINIHS 869* 778* 1,306* 1,583*     Chemistry Recent Labs  Lab 08/28/19 2317 08/29/19 0433  NA 135 137  K 4.8 5.0  CL 104  --   CO2 19*  --   GLUCOSE 145*  --   BUN 45*  --   CREATININE 2.82*  --   CALCIUM 8.9  --   GFRNONAA 20*  --   GFRAA 24*  --   ANIONGAP 12  --     No results for input(s): PROT, ALBUMIN, AST, ALT, ALKPHOS, BILITOT in the last 168 hours. Hematology Recent Labs  Lab 08/28/19 2317 08/29/19 0433  WBC 8.6  --   RBC 4.07*  --   HGB 12.5* 11.2*  HCT 38.7* 33.0*  MCV 95.1  --   MCH  30.7  --   MCHC 32.3  --   RDW 14.4  --   PLT 200  --    BNP Recent Labs  Lab 08/28/19 2317  BNP 1,035.9*    DDimer No results for input(s): DDIMER in the last 168 hours.   Radiology/Studies:  Dg Chest Port 1 [address]View  Result Date: 08/29/2019 CLINICAL DATA:  Shortness of breath for 1 week EXAM: PORTABLE CHEST 1 VIEW COMPARISON:  None. FINDINGS: Cardiac shadow is at the upper limits of normal in size. Aortic calcifications are noted. Calcified pleural plaques are noted. Small right pleural effusion is seen. Right basilar atelectatic changes are noted. No bony abnormality is seen. IMPRESSION: Right basilar atelectasis with small effusion. Bilateral calcified pleural plaques. Electronically Signed   By: Alcide Clever M.D.   On: 08/29/2019 00:21    Assessment and Plan:   Complete heart block/AV block symptomatic -We will go ahead and place a temporary pacer wire since he is transiently decreasing his effective heart rate into the 20s.  Variable block is noted.  Dr. Elberta Fortis with electrophysiology is aware.  N.p.o. past midnight for permanent pacemaker tomorrow.  N.p.o. currently for temporary wire placement.  Discussed with Dr. Clifton James of interventional cardiology.  Call has been placed to [facility].  Essential hypertension -At home on low-dose amlodipine and low-dose metoprolol.  Tammy Sours, with nursing staff is currently trying to find the exact doses of these medications.  In fact we just got an update currently on the following medications: Flomax 0.4 mg qday Atorvastatin 20 mg q hs  Amlodipine 5 mg qday Metoprolol Tartrate 12.5 mg q am  Vit D3 1000 iu daily   Dose of metoprolol is very low.  Elevated troponin -Currently approximately 1500 high-sensitivity troponin.  This is likely in the setting of complete heart block, increased myocardial demand.  Not having any active anginal symptoms.  Portends a worsened prognosis.  Aortic stenosis -Previously described as mild in 2013.  Murmur  appreciated on exam.  We are repeating echocardiogram to see if severity has increased.  Acute kidney injury -Creatinine 2.8 BUN 45.  Likely in part from hypoperfusion secondary to decreased cardiac output in the setting of severe bradycardia.  Acute presumed diastolic heart failure -BNP 1000 -Checking echocardiogram.  Tried to call significant other listed in demographics.  Went straight to [employer].  CRITICAL CARE Performed by: Donato Schultz   Total critical care time: 60 minutes  Critical care time was exclusive of separately billable procedures and treating other patients.  Critical care was necessary to treat or prevent imminent or life-threatening deterioration.  Critical care was time spent personally by me on the following activities: development of treatment plan with patient and/or surrogate as well as nursing, discussions with consultants, evaluation of patient's response to treatment, examination of patient, obtaining history from patient or surrogate, ordering and performing treatments and interventions, ordering and review of laboratory studies, ordering and review of radiographic studies, pulse oximetry and re-evaluation of patient's condition.  For questions or updates, please contact [facility] Please consult [email or web address] for contact info under     Signed, Donato Schultz, MD  08/29/2019 10:24 AM

## 2019-08-29 NOTE — Progress Notes (Signed)
EEG complete - results pending 

## 2019-08-29 NOTE — H&P (Addendum)
History and Physical    Kenneth Matthews HYI:[id number] DOB: 11/01/1940 DOA: 08/28/2019  PCP: Neale Burly, MD    Patient coming from: Home    Chief Complaint: Shortness of breath for the last week worse tonight  HPI: Kenneth Matthews is a 78 y.o. male with medical history significant of hypertension on amlodipine and metoprolol apparently low-dose Per emergency room physician patient complaining of shortness of breath for the last week worse tonight.  EMS found him in complete AV block. Patient denies any chest pain fever chills productive cough  ED Course:  Patient on external pacemaker Electrocardiogram complete AV block Patient maintained blood pressure 114/70 heart rate 44 Complaining of shortness of breath on 100% nonrebreather saturation around 90% BNP 1035, troponin is 869 BUN 45 creatinine 2.8 Chest x-ray no obvious CHF No previous records to compare the blood work and chest x-ray  Review of Systems: As per HPI otherwise 10 point review of systems negative.  Except shortness of breath and weakness  No past medical history on file. Past medical history of hypertension  Past surgical history None   has no history on file for tobacco, alcohol, and drug.  No Known Allergies  No family history on file.   Prior to Admission medications   Not on File    Physical Exam: Vitals:   08/29/19 0230 08/29/19 0315 08/29/19 0345 08/29/19 0415  BP: (!) 109/47 138/73 (!) 117/48 (!) 127/49  Pulse: (!) 44 (!) 46 (!) 41 (!) 44  Resp: 17 (!) 33 19 17  Temp:      TempSrc:      SpO2: 100% 95% 98% 100%    Constitutional: NAD, calm, comfortable Vitals:   08/29/19 0230 08/29/19 0315 08/29/19 0345 08/29/19 0415  BP: (!) 109/47 138/73 (!) 117/48 (!) 127/49  Pulse: (!) 44 (!) 46 (!) 41 (!) 44  Resp: 17 (!) 33 19 17  Temp:      TempSrc:      SpO2: 100% 95% 98% 100%   Eyes: PERRL, lids and conjunctivae normal ENMT: Mucous membranes are moist. Posterior pharynx clear of  any exudate or lesions.Normal dentition.  Neck: normal, supple, no masses, no thyromegaly Respiratory: clear to auscultation bilaterally, no wheezing, no crackles. Normal respiratory effort. No accessory muscle use.  Cardiovascular: Regular rate and rhythm, no murmurs / rubs / gallops. No extremity edema. 2+ pedal pulses. No carotid bruits.  Abdomen: no tenderness, no masses palpated. No hepatosplenomegaly. Bowel sounds positive.  Musculoskeletal: no clubbing / cyanosis. No joint deformity upper and lower extremities. Good ROM, no contractures. Normal muscle tone.  Skin: no rashes, lesions, ulcers. No induration Neurologic: CN 2-12 grossly intact. Sensation intact, DTR normal. Strength 5/5 in all 4.  Psychiatric: Normal judgment and insight. Alert and oriented x 3. Normal mood.    Labs on Admission: I have personally reviewed following labs and imaging studies  CBC: Recent Labs  Lab 08/28/19 2317 08/29/19 0433  WBC 8.6  --   NEUTROABS 7.8*  --   HGB 12.5* 11.2*  HCT 38.7* 33.0*  MCV 95.1  --   PLT 200  --    Basic Metabolic Panel: Recent Labs  Lab 08/28/19 2317 08/29/19 0433  NA 135 137  K 4.8 5.0  CL 104  --   CO2 19*  --   GLUCOSE 145*  --   BUN 45*  --   CREATININE 2.82*  --   CALCIUM 8.9  --   MG 1.9  --  GFR: CrCl cannot be calculated (Unknown ideal weight.). Liver Function Tests: No results for input(s): AST, ALT, ALKPHOS, BILITOT, PROT, ALBUMIN in the last 168 hours. No results for input(s): LIPASE, AMYLASE in the last 168 hours. No results for input(s): AMMONIA in the last 168 hours. Coagulation Profile: No results for input(s): INR, PROTIME in the last 168 hours. Cardiac Enzymes: No results for input(s): CKTOTAL, CKMB, CKMBINDEX, TROPONINI in the last 168 hours. BNP (last 3 results) No results for input(s): PROBNP in the last 8760 hours. HbA1C: No results for input(s): HGBA1C in the last 72 hours. CBG: No results for input(s): GLUCAP in the last 168  hours. Lipid Profile: No results for input(s): CHOL, HDL, LDLCALC, TRIG, CHOLHDL, LDLDIRECT in the last 72 hours. Thyroid Function Tests: No results for input(s): TSH, T4TOTAL, FREET4, T3FREE, THYROIDAB in the last 72 hours. Anemia Panel: No results for input(s): VITAMINB12, FOLATE, FERRITIN, TIBC, IRON, RETICCTPCT in the last 72 hours. Urine analysis: No results found for: COLORURINE, APPEARANCEUR, LABSPEC, PHURINE, GLUCOSEU, HGBUR, BILIRUBINUR, KETONESUR, PROTEINUR, UROBILINOGEN, NITRITE, LEUKOCYTESUR  Radiological Exams on Admission: Dg Chest Port 1 View  Result Date: 08/29/2019 CLINICAL DATA:  Shortness of breath for 1 week EXAM: PORTABLE CHEST 1 VIEW COMPARISON:  None. FINDINGS: Cardiac shadow is at the upper limits of normal in size. Aortic calcifications are noted. Calcified pleural plaques are noted. Small right pleural effusion is seen. Right basilar atelectatic changes are noted. No bony abnormality is seen. IMPRESSION: Right basilar atelectasis with small effusion. Bilateral calcified pleural plaques. Electronically Signed   By: Alcide Clever M.D.   On: 08/29/2019 00:21    EKG: Complete AV block  Assessment/Plan Active Problems:   Complete AV block (HCC)   Acute CHF (congestive heart failure) (HCC)   Essential hypertension   AKI (acute kidney injury) (HCC)   Elevated troponin   Acute respiratory failure with hypoxemia (HCC)   Assessment and plan  Acute respiratory failure with hypoxemia Secondary to new onset CHF Plan Lasix ABG O2  Acute congestive heart failure , Came with shortness of breath  Apparently new onset Patient with history of hypertension BNP 1000 with elevated troponin is 869 Plan Lasix echocardiogram, cardiology consult rule out etiology ischemic event  Complete AV block Patient maintain blood pressure and asymptomatic except shortness of breath Heart rate 44, blood pressure 127/49 With external pacemaker Patient on CA blocker and beta-blocker   For his hypertension Discussed the case with the emergency room physician AV block can be secondary to combination of the medication but can be secondary to an acute MI  Elevated troponin Can be secondary to ischemic event or CHF/renal failure Plan we will give 1 dose of Lovenox adjusted to the renal failure Cardiology consult he may be switched to Heparin IV if elevated troponin are secondary to ischemia  Essential hypertension Hold his blood pressure medication for now  Acute kidney injury Not sure if it is chronic kidney disease stage IV or acute kidney injury secondary to to CHF Plan avoid nephrotoxins follow BUN and creatinine   DVT prophylaxis: Lovenox Code Status: Full code Family Communication: Nobody in the room Disposition Plan: Discharge home Consults called:  Cardiology fellow Admission status: Full admission   Corey Caulfield G Emmaus Brandi MD [facility]  If 7PM-7AM, please contact night-coverage [email or web address]   08/29/2019, 4:56 AM

## 2019-08-29 NOTE — Progress Notes (Addendum)
Patient seen this a.m.  Chart and H&P has been reviewed.  Patient is a 78 year old male with significant history for hypertension on amlodipine and metoprolol presented complaining of shortness of breath and was found with complete AV block.  Has external pacemaker in place.  BNP elevated troponin elevated.  On NRB this am.  Also with small area of dry non suppurative rash which pt reports being shingles on his LLE.  A/p: 1.Complete AVB- on external pacer 2.Elevated tp- cp free. +sob 3.Ess. HTN 4.AKI v.s. CKD- no baseline labs found. If acute, likely 2/2 hypoperfusion   -Cardiology consulted - ck am labs - ck echo   Received call by nursing patient had several seizure-like activity on the way to pacemaker procedure and also in Cath Lab.  Neurology was consulted I spoke to Dr. Lorraine Lax, he will f/u with pt. Will obtain ct head

## 2019-08-30 ENCOUNTER — Inpatient Hospital Stay (HOSPITAL_COMMUNITY): Payer: Medicare HMO

## 2019-08-30 ENCOUNTER — Encounter (HOSPITAL_COMMUNITY): Payer: Self-pay

## 2019-08-30 DIAGNOSIS — I248 Other forms of acute ischemic heart disease: Secondary | ICD-10-CM

## 2019-08-30 DIAGNOSIS — I5031 Acute diastolic (congestive) heart failure: Secondary | ICD-10-CM

## 2019-08-30 DIAGNOSIS — N289 Disorder of kidney and ureter, unspecified: Secondary | ICD-10-CM

## 2019-08-30 LAB — COMPREHENSIVE METABOLIC PANEL
ALT: 43 U/L (ref 0–44)
AST: 37 U/L (ref 15–41)
Albumin: 2.9 g/dL — ABNORMAL LOW (ref 3.5–5.0)
Alkaline Phosphatase: 64 U/L (ref 38–126)
Anion gap: 12 (ref 5–15)
BUN: 52 mg/dL — ABNORMAL HIGH (ref 8–23)
CO2: 19 mmol/L — ABNORMAL LOW (ref 22–32)
Calcium: 8.2 mg/dL — ABNORMAL LOW (ref 8.9–10.3)
Chloride: 108 mmol/L (ref 98–111)
Creatinine, Ser: 2.65 mg/dL — ABNORMAL HIGH (ref 0.61–1.24)
GFR calc Af Amer: 25 mL/min — ABNORMAL LOW (ref >60)
GFR calc non Af Amer: 22 mL/min — ABNORMAL LOW (ref >60)
Glucose, Bld: 109 mg/dL — ABNORMAL HIGH (ref 70–99)
Potassium: 4.1 mmol/L (ref 3.5–5.1)
Sodium: 139 mmol/L (ref 135–145)
Total Bilirubin: 0.9 mg/dL (ref 0.3–1.2)
Total Protein: 7.2 g/dL (ref 6.5–8.1)

## 2019-08-30 LAB — CBC
HCT: 33.7 % — ABNORMAL LOW (ref 39.0–52.0)
Hemoglobin: 11.3 g/dL — ABNORMAL LOW (ref 13.0–17.0)
MCH: 30.8 pg (ref 26.0–34.0)
MCHC: 33.5 g/dL (ref 30.0–36.0)
MCV: 91.8 fL (ref 80.0–100.0)
Platelets: 190 10*3/uL (ref 150–400)
RBC: 3.67 MIL/uL — ABNORMAL LOW (ref 4.22–5.81)
RDW: 14.1 % (ref 11.5–15.5)
WBC: 13.9 10*3/uL — ABNORMAL HIGH (ref 4.0–10.5)
nRBC: 0 % (ref 0.0–0.2)

## 2019-08-30 LAB — BRAIN NATRIURETIC PEPTIDE: B Natriuretic Peptide: 1100.6 pg/mL — ABNORMAL HIGH (ref 0.0–100.0)

## 2019-08-30 MED ORDER — SODIUM CHLORIDE 0.9% FLUSH: 10.0000 mL | Freq: Two times a day (BID) | INTRAVENOUS | Status: DC

## 2019-08-30 MED ORDER — MORPHINE SULFATE (PF) 2 MG/ML IV SOLN
1.0000 mg | INTRAVENOUS | Status: DC | PRN
  Administered 2019-08-30 – 2019-08-31 (×3): 2 mg via INTRAVENOUS
  Filled 2019-08-30 (×4): qty 1

## 2019-08-30 MED ORDER — SODIUM CHLORIDE 0.9% FLUSH: 10.0000 mL | INTRAVENOUS | Status: DC | PRN

## 2019-08-30 NOTE — Consult Note (Signed)
Cardiology Consultation:   Patient ID: Kenneth Matthews MRN: [id number]; DOB: [birth year]  Admit date: 08/28/2019 Date of Consult: 08/30/2019  Primary Care Provider: Toma Deiters, MD Primary Cardiologist: No primary care provider on file.  Primary Electrophysiologist:  None    Patient Profile:   Kenneth Matthews is a 78 y.o. male with a hx of hypertension who is being seen today for the evaluation of complete heart block at the request of Donato Schultz.  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  He has been having shortness of breath over the last week which got worse 2 days ago.  Telemetry yesterday demonstrated complete heart block with heart rates at times in the 20s.  Due to that, he had a temporary wire placed.  Heart Pathway Score:     Past Medical History:  Diagnosis Date  . Hypertension     History reviewed. No pertinent surgical history.   Home Medications:  Prior to Admission medications   Medication Sig Start Date End Date Taking? Authorizing Provider  albuterol (VENTOLIN HFA) 108 (90 Base) MCG/ACT inhaler Inhale 2 puffs into the lungs every 4 (four) hours as needed for wheezing or shortness of breath. 08/06/19   [provider]  amLODipine (NORVASC) 5 MG tablet Take 5 mg by mouth daily. 08/24/19   [provider]  atorvastatin (LIPITOR) 20 MG tablet Take 20 mg by mouth at bedtime. 07/06/19   [provider]  metoprolol tartrate (LOPRESSOR) 25 MG tablet Take 12.5 mg by mouth every morning. 07/13/19   [provider]  tamsulosin (FLOMAX) 0.4 MG CAPS capsule Take 0.4 mg by mouth daily. 08/24/19   [provider]    Inpatient Medications: Scheduled Meds: . Chlorhexidine Gluconate Cloth  6 each Topical Daily  . sodium chloride flush  10-40 mL Intracatheter Q12H  . sodium chloride flush   3 mL Intravenous Q12H   Continuous Infusions: . sodium chloride 10 mL/hr at 08/30/19 0600  . cefTRIAXone (ROCEPHIN)  IV 1 g (08/30/19 1478)  . levETIRAcetam Stopped (08/30/19 0057)   PRN Meds: sodium chloride, Melatonin, morphine injection, sodium chloride flush, sodium chloride flush  Allergies:   No Known Allergies  Social History:   Social History   Socioeconomic History  . Marital status: Single    Spouse name: Not on file  . Number of children: Not on file  . Years of education: Not on file  . Highest education level: Not on file  Occupational History  . Not on file  Social Needs  . Financial resource strain: Not on file  . Food insecurity    Worry: Not on file    Inability: Not on file  . Transportation needs    Medical: Not on file    Non-medical: Not on file  Tobacco Use  . Smoking status: Former Games developer  . Smokeless tobacco: Never Used  Substance and Sexual Activity  . Alcohol use: Not on file  . Drug use: Not on file  . Sexual activity: Not on file  Lifestyle  . Physical activity    Days per week: Not on file    Minutes per session: Not on file  . Stress: Not on file  Relationships  . Social Musician on phone: Not on file    Gets together: Not on file    Attends  religious service: Not on file    Active member of club or organization: Not on file    Attends meetings of clubs or organizations: Not on file    Relationship status: Not on file  . Intimate partner violence    Fear of current or ex partner: Not on file    Emotionally abused: Not on file    Physically abused: Not on file    Forced sexual activity: Not on file  Other Topics Concern  . Not on file  Social History Narrative  . Not on file    Family History:   No family history of CAD or heart disease  ROS:  Please see the history of present illness.   All other ROS reviewed and negative.     Physical Exam/Data:   Vitals:   08/30/19 0530 08/30/19 0600 08/30/19 0630  08/30/19 0700  BP: 112/64 127/67  (!) 102/56  Pulse: 89 90 87 87  Resp: (!) 24 15 15  (!) 29  Temp:      TempSrc:      SpO2: 97% 90% 91% 95%  Weight:   55.2 kg   Height:        Intake/Output Summary (Last 24 hours) at 08/30/2019 0749 Last data filed at 08/30/2019 0600 Gross per 24 hour  Intake 538.8 ml  Output 1550 ml  Net -1011.2 ml   Last 3 Weights 08/30/2019 08/29/2019  Weight (lbs) 121 lb 11.1 oz 122 lb 12.7 oz  Weight (kg) 55.2 kg 55.7 kg     Body mass index is 16.97 kg/m.  General:  Well nourished, well developed, in no acute distress HEENT: normal Lymph: no adenopathy Neck: no JVD Endocrine:  No thryomegaly Vascular: No carotid bruits; FA pulses 2+ bilaterally without bruits  Cardiac:  normal S1, S2; RRR; no murmur  Lungs:  clear to auscultation bilaterally, no wheezing, rhonchi or rales  Abd: soft, nontender, no hepatomegaly  Ext: no edema Musculoskeletal:  No deformities, BUE and BLE strength normal and equal Skin: warm and dry  Neuro:  CNs 2-12 intact, no focal abnormalities noted Psych:  Normal affect   EKG:  The EKG was personally reviewed and demonstrates: Sinus rhythm, complete heart block Telemetry:  Telemetry was personally reviewed and demonstrates: Sinus rhythm, intermittent complete AV block  Relevant CV Studies: TTE 08/29/19  1. Left ventricular ejection fraction, by visual estimation, is 65 to 70%. The left ventricle has normal function. There is severely increased left ventricular hypertrophy.  2. Left ventricular diastolic parameters are indeterminate.  3. Global right ventricle was not well visualized.The right ventricular size is not well visualized. Right vetricular wall thickness was not assessed.  4. Left atrial size was mildly dilated.  5. Right atrial size was normal.  6. Moderate thickening of the mitral valve leaflet(s).  7. Moderate to severe mitral annular calcification.  8. The mitral valve is abnormal. Mild mitral valve  regurgitation. Moderate mitral stenosis.  9. MV peak gradient, 16.8 mmHg. 10. The tricuspid valve is grossly normal. Tricuspid valve regurgitation is trivial. 11. Aortic valve area, by VTI measures 1.90 cm. 12. Aortic valve mean gradient measures 12.0 mmHg. 13. Aortic valve peak gradient measures 21.5 mmHg. 14. The aortic valve is tricuspid. Aortic valve regurgitation is trivial. Mild aortic valve stenosis. 15. The pulmonic valve was grossly normal. Pulmonic valve regurgitation is not visualized. 16. The inferior vena cava is normal in size with greater than 50% respiratory variability, suggesting right atrial pressure of 3 mmHg.  Laboratory Data:  High Sensitivity Troponin:   Recent Labs  Lab 08/28/19 2317 08/29/19 0200 08/29/19 0529 08/29/19 0711  TROPONINIHS 869* 778* 1,306* 1,583*     Chemistry Recent Labs  Lab 08/28/19 2317 08/29/19 0433 08/30/19 0233  NA 135 137 139  K 4.8 5.0 4.1  CL 104  --  108  CO2 19*  --  19*  GLUCOSE 145*  --  109*  BUN 45*  --  52*  CREATININE 2.82*  --  2.65*  CALCIUM 8.9  --  8.2*  GFRNONAA 20*  --  22*  GFRAA 24*  --  25*  ANIONGAP 12  --  12    Recent Labs  Lab 08/30/19 0233  PROT 7.2  ALBUMIN 2.9*  AST 37  ALT 43  ALKPHOS 64  BILITOT 0.9   Hematology Recent Labs  Lab 08/28/19 2317 08/29/19 0433 08/30/19 0233  WBC 8.6  --  13.9*  RBC 4.07*  --  3.67*  HGB 12.5* 11.2* 11.3*  HCT 38.7* 33.0* 33.7*  MCV 95.1  --  91.8  MCH 30.7  --  30.8  MCHC 32.3  --  33.5  RDW 14.4  --  14.1  PLT 200  --  190   BNP Recent Labs  Lab 08/28/19 2317  BNP 1,035.9*    DDimer No results for input(s): DDIMER in the last 168 hours.   Radiology/Studies:  Ct Head Wo Contrast  Result Date: 08/29/2019 CLINICAL DATA:  Initial evaluation for acute seizure like activity. EXAM: CT HEAD WITHOUT CONTRAST TECHNIQUE: Contiguous axial images were obtained from the base of the skull through the vertex without intravenous contrast. COMPARISON:   None available. FINDINGS: Brain: Moderately advanced age-related cerebral atrophy. Patchy and confluent hypodensity within the periventricular deep white matter both cerebral hemispheres most consistent with chronic small vessel ischemic disease, moderate in nature. Few scattered remote lacunar infarcts noted within the bilateral basal ganglia and cerebellum. No acute intracranial hemorrhage. No acute large vessel territory infarct. No mass lesion, midline shift or mass effect. No hydrocephalus. No extra-axial fluid collection. Vascular: No hyperdense vessel. Calcified atherosclerosis at the skull base. Skull: Scalp soft tissues and calvarium within normal limits. Sinuses/Orbits: Globes and orbital soft tissues demonstrate no acute finding. Paranasal sinuses and mastoid air cells are clear. Other: None. IMPRESSION: 1. No acute intracranial abnormality. 2. Moderately advanced age-related cerebral atrophy with chronic small vessel ischemic disease. Electronically Signed   By: Jeannine Boga M.D.   On: 08/29/2019 14:12   Dg Chest Port 1 View  Result Date: 08/29/2019 CLINICAL DATA:  Shortness of breath for 1 week EXAM: PORTABLE CHEST 1 VIEW COMPARISON:  None. FINDINGS: Cardiac shadow is at the upper limits of normal in size. Aortic calcifications are noted. Calcified pleural plaques are noted. Small right pleural effusion is seen. Right basilar atelectatic changes are noted. No bony abnormality is seen. IMPRESSION: Right basilar atelectasis with small effusion. Bilateral calcified pleural plaques. Electronically Signed   By: Inez Catalina M.D.   On: 08/29/2019 00:21    Assessment and Plan:   1. Complete heart block: Fortunately he now has AV conduction.  I have turned his pacemaker rate down to 40.  If he does not require V pacing, we Boyde Grieco plan to pull the temporary wire later today.  He is currently n.p.o., but has no procedures are scheduled for today, we Nikiya Starn give him a diet.  He did tell me that he  does not want a pacemaker unless absolutely necessary.  We Loryn Haacke  continue to monitor overnight with the pacemaker wire out.  He does take 12.5 mg of metoprolol which has since washed out with resumption of AV conduction.  2.  Hypertension:Well controlled  3.  Acute renal failure: Potentially due to hypoperfusion.  Has been improving with improved heart rates.  4.  Elevated troponin: Likely in the setting of complete heart block with increased myocardial demand.  No angina.  Aneesha Holloran likely need an outpatient ischemic evaluation.       For questions or updates, please contact [facility] Please consult [email or web address] for contact info under     Signed, Alhassan Everingham Jorja LoaMartin Jilliann Subramanian, MD  08/30/2019 7:49 AM

## 2019-08-30 NOTE — Evaluation (Signed)
Clinical/Bedside Swallow Evaluation Patient Details  Name: Kenneth Matthews MRN: [id number] Date of Birth: Feb 25, 1941  Today's Date: 08/30/2019 Time: SLP Start Time (ACUTE ONLY): 6767 SLP Stop Time (ACUTE ONLY): 1355 SLP Time Calculation (min) (ACUTE ONLY): 1342 min  Past Medical History:  Past Medical History:  Diagnosis Date  . Hypertension    Past Surgical History: History reviewed. No pertinent surgical history. HPI:  Kenneth Matthews is a 78 y.o. male with medical history significant of hypertension on amlodipine and metoprolol presented with SOB.  EMS found him in complete AV block.Had elevated BNP and TP.    Assessment / Plan / Recommendation Clinical Impression  Patient presents with indication of a severe pharyngeal dysphagia with possible esophageal component. Patient reports globus, ongoing but worsening for approximately five years. Skilled obsevation during examination revealed consistent multiple swallows per bite/sip trialed followed by onset of wet vocal quality, throat clearing, and coughing with expectoration of bolus via yankauers. No change despite SLP manipulation of bolus size or presentation. Instrumental exam needed. Can complete MBS 11/16. Recommend NPO except necessary meds crushed in puree and ice chips, small sips of H2O only after oral care. Education complete with patient, wife, and Therapist, sports.  SLP Visit Diagnosis: Dysphagia, pharyngoesophageal phase (R13.14)(suspected)    Aspiration Risk  Severe aspiration risk    Diet Recommendation NPO except meds;Ice chips PRN after oral care   Medication Administration: Crushed with puree    Other  Recommendations Oral Care Recommendations: Oral care QID;Oral care prior to ice chip/H20   Follow up Recommendations (TBD)        Swallow Study   General HPI: Kenneth Matthews is a 78 y.o. male with medical history significant of hypertension on amlodipine and metoprolol presented with SOB.  EMS found him in complete AV  block.Had elevated BNP and TP.  Type of Study: Bedside Swallow Evaluation Previous Swallow Assessment: none Diet Prior to this Study: Regular;Thin liquids Temperature Spikes Noted: No Respiratory Status: Nasal cannula History of Recent Intubation: No Behavior/Cognition: Alert;Cooperative;Pleasant mood Oral Cavity Assessment: Within Functional Limits Oral Care Completed by SLP: Recent completion by staff Oral Cavity - Dentition: Adequate natural dentition Vision: Functional for self-feeding Self-Feeding Abilities: Able to feed self Patient Positioning: Upright in bed Baseline Vocal Quality: Wet Volitional Cough: Strong Volitional Swallow: Able to elicit    Oral/Motor/Sensory Function Overall Oral Motor/Sensory Function: Within functional limits   Ice Chips Ice chips: Not tested   Thin Liquid Thin Liquid: Impaired Presentation: Cup;Self Fed;Straw Pharyngeal  Phase Impairments: Multiple swallows;Wet Vocal Quality;Throat Clearing - Immediate;Cough - Immediate;Cough - Delayed    Nectar Thick Nectar Thick Liquid: Not tested   Honey Thick Honey Thick Liquid: Not tested   Puree Puree: Impaired Presentation: Spoon Pharyngeal Phase Impairments: Multiple swallows;Wet Vocal Quality;Throat Clearing - Immediate;Throat Clearing - Delayed;Cough - Immediate;Cough - Delayed   Solid     Solid: Impaired Presentation: Spoon;Self Fed Pharyngeal Phase Impairments: Multiple swallows;Wet Vocal Quality;Cough - Immediate;Throat Clearing - Immediate     Kenneth Pyeatt MA, CCC-SLP   Deshana Rominger Meryl 08/30/2019,4:01 PM

## 2019-08-30 NOTE — Progress Notes (Addendum)
NEURO HOSPITALIST PROGRESS NOTE   Subjective: Patient in bed resting comfortably on 2L [place],. NAD. No further events since procedure yesterday.  Exam: Vitals:   08/30/19 0900 08/30/19 0930  BP: 111/66   Pulse: 82 88  Resp: 19 (!) 23  Temp:    SpO2: 97% 100%    Physical Exam  Constitutional: Appears well-developed and well-nourished.  Psych: Affect appropriate to situation Eyes: Normal external eye and conjunctiva. HENT: Normocephalic, no lesions, without obvious abnormality.   Musculoskeletal-no joint tenderness, deformity or swelling Cardiovascular: Normal rate and regular rhythm.  Respiratory: Effort normal, non-labored breathing saturations WNL on 2L [place] GI: Soft.  No distension. There is no tenderness.  Skin: WDI   Neuro:  Mental Status: Alert, oriented, thought content appropriate.  Speech fluent without evidence of aphasia.  Able to follow  commands without difficulty. Cranial Nerves: PJ:KDTOIZ fields grossly normal,  III,IV, VI: ptosis not present, extra-ocular motions intact bilaterally pupils equal, round, reactive to light and accommodation V,VII: smile symmetric, facial light touch sensation normal bilaterally VIII: hearing normal bilaterally IX,X: uvula rises symmetrically XI: bilateral shoulder shrug XII: midline tongue extension Motor: Right : Upper extremity   5/5 Left:     Upper extremity   5/5  Lower extremity   5/5  Lower extremity   5/5 Tone and bulk:normal tone throughout; no atrophy noted Sensory:  light touch intact throughout, bilaterally Deep Tendon Reflexes: 2+ and symmetric biceps, patella Plantars: Right: downgoing   Left: downgoing Cerebellar: No ataxia FNF Gait: deferred    Medications:  Scheduled: . Chlorhexidine Gluconate Cloth  6 each Topical Daily  . sodium chloride flush  10-40 mL Intracatheter Q12H  . sodium chloride flush  3 mL Intravenous Q12H   Continuous: . sodium chloride 10 mL/hr at 08/30/19 0600  .  cefTRIAXone (ROCEPHIN)  IV 1 g (08/30/19 1245)  . levETIRAcetam Stopped (08/30/19 0057)   YKD:XIPJAS chloride, Melatonin, morphine injection, sodium chloride flush, sodium chloride flush  Pertinent Labs/Diagnostics:  routine EEG 08/29/2019  IMPRESSION: This study is within normal limits. No seizures or epileptiform discharges were seen throughout the recording.  Ct Head Wo Contrast  Result Date: 08/29/2019 CLINICAL DATA:  Initial evaluation for acute seizure like activity. EXAM: CT HEAD WITHOUT CONTRAST TECHNIQUE: Contiguous axial images were obtained from the base of the skull through the vertex without intravenous contrast. COMPARISON:  None available. FINDINGS: Brain: Moderately advanced age-related cerebral atrophy. Patchy and confluent hypodensity within the periventricular deep white matter both cerebral hemispheres most consistent with chronic small vessel ischemic disease, moderate in nature. Few scattered remote lacunar infarcts noted within the bilateral basal ganglia and cerebellum. No acute intracranial hemorrhage. No acute large vessel territory infarct. No mass lesion, midline shift or mass effect. No hydrocephalus. No extra-axial fluid collection. Vascular: No hyperdense vessel. Calcified atherosclerosis at the skull base. Skull: Scalp soft tissues and calvarium within normal limits. Sinuses/Orbits: Globes and orbital soft tissues demonstrate no acute finding. Paranasal sinuses and mastoid air cells are clear. Other: None. IMPRESSION: 1. No acute intracranial abnormality. 2. Moderately advanced age-related cerebral atrophy with chronic small vessel ischemic disease. Electronically Signed   By: Rise Mu M.D.   On: 08/29/2019 14:12   Dg Chest Port 1 View  Result Date: 08/29/2019 CLINICAL DATA:  Shortness of breath for 1 week EXAM: PORTABLE CHEST 1 VIEW COMPARISON:  None. FINDINGS: Cardiac shadow is at the  upper limits of normal in size. Aortic calcifications are noted.  Calcified pleural plaques are noted. Small right pleural effusion is seen. Right basilar atelectatic changes are noted. No bony abnormality is seen. IMPRESSION: Right basilar atelectasis with small effusion. Bilateral calcified pleural plaques. Electronically Signed   By: Inez Catalina M.D.   On: 08/29/2019 00:21    Assessment:  Kenneth Matthews is an 78 y.o. male  With Kenneth  MI Matthews ( 2014), COPD, HTN, PVD who [facility] ED [facility] 08/28/2019 c/o SOB and generalized weakness for about 1 week. Neurology consulted 08/29/2019 seizure. The [place] did not show anything a cute. The EEG did not show any seizures.  Impression: Seizure vs convulsive syncope  Recommendations: -- keppra 500 mg BID, however d/c if seizure like spells occurred while patient having heart block.   Laurey Morale, MSN, NP-C [facility] [phone]  Attending neurologist's note to follow    08/30/2019, 9:55 AM   NEUROHOSPITALIST ADDENDUM Performed a face to face diagnostic evaluation.   I have reviewed the contents of history and physical exam as documented by PA/ARNP/Resident and agree with above documentation.  I have discussed and formulated the above plan as documented. Edits to the note have been made as needed.  Attempted to call cardiologist Dr. Marlou Porch who witnessed the spells. Continue Keppra 500 mg twice daily for now, however heightens these are convulsive syncopal episodes in the setting of bradycardia and patient may not need to be AED.  Outpatient neurology follow-up in 1 month with consideration of possibly discontinuing Keppra if patient remained seizure-free.    Karena Addison Nate Perri MD [facility] [phone]   If 7pm to 7am, please call on call as listed on AMION.

## 2019-08-30 NOTE — Progress Notes (Signed)
Progress Note  Patient Name: Kenneth PiaRobert G Girdler Date of Encounter: 08/30/2019  Primary Cardiologist: Donato SchultzMark Skains, MD   Subjective   Laying flat, comfortable in bed.  Looks ill but able to answer questions.  Recent rash left lower extremity reports his shingles.  Inpatient Medications    Scheduled Meds: . Chlorhexidine Gluconate Cloth  6 each Topical Daily  . sodium chloride flush  10-40 mL Intracatheter Q12H  . sodium chloride flush  3 mL Intravenous Q12H   Continuous Infusions: . sodium chloride 10 mL/hr at 08/30/19 0600  . cefTRIAXone (ROCEPHIN)  IV 1 g (08/30/19 16100628)  . levETIRAcetam Stopped (08/30/19 0057)   PRN Meds: sodium chloride, Melatonin, morphine injection, sodium chloride flush, sodium chloride flush   Vital Signs    Vitals:   08/30/19 0630 08/30/19 0700 08/30/19 0800 08/30/19 0818  BP:  (!) 102/56 (!) 114/58 (!) 114/58  Pulse: 87 87 67 75  Resp: 15 (!) 29 17 (!) 22  Temp:    (!) 97.3 F (36.3 C)  TempSrc:    Oral  SpO2: 91% 95% 97% 97%  Weight: 55.2 kg     Height:        Intake/Output Summary (Last 24 hours) at 08/30/2019 0834 Last data filed at 08/30/2019 0600 Gross per 24 hour  Intake 538.8 ml  Output 1550 ml  Net -1011.2 ml   Last 3 Weights 08/30/2019 08/29/2019  Weight (lbs) 121 lb 11.1 oz 122 lb 12.7 oz  Weight (kg) 55.2 kg 55.7 kg      Telemetry    Occasional beats of ventricular pacing in succession 5-7 beats, this morning sinus rhythm with normal-appearing PR interval.  Yesterday complete heart block heart rate as low as 24 bpm- Personally Reviewed  ECG    11/14-complete heart block- Personally Reviewed  Physical Exam   GEN: Thin, ill-appearing Neck: No JVD nasal cannula oxygen Cardiac: RRR, 2/6 systolic murmur, rubs, or gallops.  Respiratory: Clear to auscultation bilaterally. GI: Soft, nontender, non-distended  MS: No edema; No deformity. Neuro:  Nonfocal  Psych: Normal affect  Temporary pacer wire in place  Labs     High Sensitivity Troponin:   Recent Labs  Lab 08/28/19 2317 08/29/19 0200 08/29/19 0529 08/29/19 0711  TROPONINIHS 869* 778* 1,306* 1,583*      Chemistry Recent Labs  Lab 08/28/19 2317 08/29/19 0433 08/30/19 0233  NA 135 137 139  K 4.8 5.0 4.1  CL 104  --  108  CO2 19*  --  19*  GLUCOSE 145*  --  109*  BUN 45*  --  52*  CREATININE 2.82*  --  2.65*  CALCIUM 8.9  --  8.2*  PROT  --   --  7.2  ALBUMIN  --   --  2.9*  AST  --   --  37  ALT  --   --  43  ALKPHOS  --   --  64  BILITOT  --   --  0.9  GFRNONAA 20*  --  22*  GFRAA 24*  --  25*  ANIONGAP 12  --  12     Hematology Recent Labs  Lab 08/28/19 2317 08/29/19 0433 08/30/19 0233  WBC 8.6  --  13.9*  RBC 4.07*  --  3.67*  HGB 12.5* 11.2* 11.3*  HCT 38.7* 33.0* 33.7*  MCV 95.1  --  91.8  MCH 30.7  --  30.8  MCHC 32.3  --  33.5  RDW 14.4  --  14.1  PLT 200  --  190    BNP Recent Labs  Lab 08/28/19 2317  BNP 1,035.9*     DDimer No results for input(s): DDIMER in the last 168 hours.   Radiology    Ct Head Wo Contrast  Result Date: 08/29/2019 CLINICAL DATA:  Initial evaluation for acute seizure like activity. EXAM: CT HEAD WITHOUT CONTRAST TECHNIQUE: Contiguous axial images were obtained from the base of the skull through the vertex without intravenous contrast. COMPARISON:  None available. FINDINGS: Brain: Moderately advanced age-related cerebral atrophy. Patchy and confluent hypodensity within the periventricular deep white matter both cerebral hemispheres most consistent with chronic small vessel ischemic disease, moderate in nature. Few scattered remote lacunar infarcts noted within the bilateral basal ganglia and cerebellum. No acute intracranial hemorrhage. No acute large vessel territory infarct. No mass lesion, midline shift or mass effect. No hydrocephalus. No extra-axial fluid collection. Vascular: No hyperdense vessel. Calcified atherosclerosis at the skull base. Skull: Scalp soft tissues and  calvarium within normal limits. Sinuses/Orbits: Globes and orbital soft tissues demonstrate no acute finding. Paranasal sinuses and mastoid air cells are clear. Other: None. IMPRESSION: 1. No acute intracranial abnormality. 2. Moderately advanced age-related cerebral atrophy with chronic small vessel ischemic disease. Electronically Signed   By: Rise Mu M.D.   On: 08/29/2019 14:12   Dg Chest Port 1 View  Result Date: 08/29/2019 CLINICAL DATA:  Shortness of breath for 1 week EXAM: PORTABLE CHEST 1 VIEW COMPARISON:  None. FINDINGS: Cardiac shadow is at the upper limits of normal in size. Aortic calcifications are noted. Calcified pleural plaques are noted. Small right pleural effusion is seen. Right basilar atelectatic changes are noted. No bony abnormality is seen. IMPRESSION: Right basilar atelectasis with small effusion. Bilateral calcified pleural plaques. Electronically Signed   By: Alcide Clever M.D.   On: 08/29/2019 00:21    Cardiac Studies   ECHO 08/29/2019:   1. Left ventricular ejection fraction, by visual estimation, is 65 to 70%. The left ventricle has normal function. There is severely increased left ventricular hypertrophy.  2. Left ventricular diastolic parameters are indeterminate.  3. Global right ventricle was not well visualized.The right ventricular size is not well visualized. Right vetricular wall thickness was not assessed.  4. Left atrial size was mildly dilated.  5. Right atrial size was normal.  6. Moderate thickening of the mitral valve leaflet(s).  7. Moderate to severe mitral annular calcification.  8. The mitral valve is abnormal. Mild mitral valve regurgitation. Moderate mitral stenosis.  9. MV peak gradient, 16.8 mmHg. 10. The tricuspid valve is grossly normal. Tricuspid valve regurgitation is trivial. 11. Aortic valve area, by VTI measures 1.90 cm. 12. Aortic valve mean gradient measures 12.0 mmHg. 13. Aortic valve peak gradient measures 21.5  mmHg. 14. The aortic valve is tricuspid. Aortic valve regurgitation is trivial. Mild aortic valve stenosis. 15. The pulmonic valve was grossly normal. Pulmonic valve regurgitation is not visualized. 16. The inferior vena cava is normal in size with greater than 50% respiratory variability, suggesting right atrial pressure of 3 mmHg.  Patient Profile     78 y.o. male with complete heart block, acute kidney injury, seizure-like activity  Assessment & Plan    Complete heart block/AV block/symptomatic bradycardia -Was taking metoprolol tartrate 12.5 mg once a day at home. -Has not had a dose for 24 hours. -This morning intermittently pacing but conducting at a heart rate of approximately 80 bpm.  Earlier conducting at atrial tachycardia 124 bpm which was his underlying  rhythm prior to temp wire. -Continue to monitor now that he has conduction. -Dr. Curt Bears with EP following.  Further decisions on permanent pacing to be determined.  Elevated troponin -1500 high-sensitivity troponin likely secondary to increased demand ischemia in the setting of prolonged complete heart block.  Increases overall risk.   Seizure-like activity -Neurology has been seen.  EEG performed.  Per primary team.  Acute kidney injury -Creatinine is 2.65.  Slight improvement from 2.8.  Aortic stenosis -Mild  Acute diastolic heart failure -Exacerbated by prolonged bradycardia.  Improved.  He is off of facemask oxygen.  Spoke to wife yesterday.       For questions or updates, please contact [facility] Please consult [email or web address] for contact info under        Signed, Candee Furbish, MD  08/30/2019, 8:34 AM

## 2019-08-30 NOTE — Progress Notes (Signed)
PROGRESS NOTE    Kenneth Matthews  HBZ:[id number] DOB: 02/27/1941 DOA: 08/28/2019 PCP: Toma Deiters, MD    Brief Narrative:  Kenneth Matthews is a 78 y.o. male with medical history significant of hypertension on amlodipine and metoprolol presented with SOB.  EMS found him in complete AV block.Had elevated BNP and TP.  While as inpatient he had several episodes of seizure-like activity and neurology was consulted.    Consultants:   Cardiology ,neurology  Procedures:  Temporary pacemaker -08/29/19  Echo : 08/29/19 1. Left ventricular ejection fraction, by visual estimation, is 65 to 70%. The left ventricle has normal function. There is severely increased left ventricular hypertrophy.  2. Left ventricular diastolic parameters are indeterminate.  3. Global right ventricle was not well visualized.The right ventricular size is not well visualized. Right vetricular wall thickness was not assessed.  4. Left atrial size was mildly dilated.  5. Right atrial size was normal.  6. Moderate thickening of the mitral valve leaflet(s).  7. Moderate to severe mitral annular calcification.  8. The mitral valve is abnormal. Mild mitral valve regurgitation. Moderate mitral stenosis.  9. MV peak gradient, 16.8 mmHg. 10. The tricuspid valve is grossly normal. Tricuspid valve regurgitation is trivial. 11. Aortic valve area, by VTI measures 1.90 cm. 12. Aortic valve mean gradient measures 12.0 mmHg. 13. Aortic valve peak gradient measures 21.5 mmHg. 14. The aortic valve is tricuspid. Aortic valve regurgitation is trivial. Mild aortic valve stenosis. 15. The pulmonic valve was grossly normal. Pulmonic valve regurgitation is not visualized. 16. The inferior vena cava is normal in size with greater than 50% respiratory variability, suggesting right atrial pressure of 3 mmHg. Antimicrobials:   ceftrixone   Subjective: Pt laying in bed. Denies sob, cp, dizziness, lightheadeness.  Overnight 1.5L  UO.  Objective: Vitals:   08/30/19 0500 08/30/19 0530 08/30/19 0600 08/30/19 0630  BP: 124/68 112/64 127/67   Pulse: 95 89 90 87  Resp: (!) 24 (!) 24 15 15   Temp:      TempSrc:      SpO2: 97% 97% 90% 91%  Weight:    55.2 kg  Height:        Intake/Output Summary (Last 24 hours) at 08/30/2019 0717 Last data filed at 08/30/2019 0600 Gross per 24 hour  Intake 538.8 ml  Output 1550 ml  Net -1011.2 ml   Filed Weights   08/29/19 0508 08/30/19 0630  Weight: 55.7 kg 55.2 kg    Examination:  General exam: Appears calm and comfortable , laying in bed Respiratory system: Clear to auscultation. Respiratory effort normal.Decrease bs at bases, no wheezing Cardiovascular system: S1 & S2 heard, RRR. No JVD, +2/6SEM, no rubs, gallops or clicks.  Gastrointestinal system: Abdomen is nondistended, soft and nontender. Normal bowel sounds heard. Central nervous system: Alert and oriented. No focal neurological deficits. Extremities: no edema b/l Skin: warm , dry Psychiatry:  Mood & affect appropriate.     Data Reviewed: I have personally reviewed following labs and imaging studies  CBC: Recent Labs  Lab 08/28/19 2317 08/29/19 0433 08/30/19 0233  WBC 8.6  --  13.9*  NEUTROABS 7.8*  --   --   HGB 12.5* 11.2* 11.3*  HCT 38.7* 33.0* 33.7*  MCV 95.1  --  91.8  PLT 200  --  190   Basic Metabolic Panel: Recent Labs  Lab 08/28/19 2317 08/29/19 0433 08/30/19 0233  NA 135 137 139  K 4.8 5.0 4.1  CL 104  --  108  CO2 19*  --  19*  GLUCOSE 145*  --  109*  BUN 45*  --  52*  CREATININE 2.82*  --  2.65*  CALCIUM 8.9  --  8.2*  MG 1.9  --   --    GFR: Estimated Creatinine Clearance: 17.6 mL/min (A) (by C-G formula based on SCr of 2.65 mg/dL (H)). Liver Function Tests: Recent Labs  Lab 08/30/19 0233  AST 37  ALT 43  ALKPHOS 64  BILITOT 0.9  PROT 7.2  ALBUMIN 2.9*   No results for input(s): LIPASE, AMYLASE in the last 168 hours. No results for input(s): AMMONIA in the last  168 hours. Coagulation Profile: No results for input(s): INR, PROTIME in the last 168 hours. Cardiac Enzymes: No results for input(s): CKTOTAL, CKMB, CKMBINDEX, TROPONINI in the last 168 hours. BNP (last 3 results) No results for input(s): PROBNP in the last 8760 hours. HbA1C: No results for input(s): HGBA1C in the last 72 hours. CBG: No results for input(s): GLUCAP in the last 168 hours. Lipid Profile: No results for input(s): CHOL, HDL, LDLCALC, TRIG, CHOLHDL, LDLDIRECT in the last 72 hours. Thyroid Function Tests: No results for input(s): TSH, T4TOTAL, FREET4, T3FREE, THYROIDAB in the last 72 hours. Anemia Panel: No results for input(s): VITAMINB12, FOLATE, FERRITIN, TIBC, IRON, RETICCTPCT in the last 72 hours. Sepsis Labs: No results for input(s): PROCALCITON, LATICACIDVEN in the last 168 hours.  Recent Results (from the past 240 hour(s))  SARS CORONAVIRUS 2 (TAT 6-24 HRS) Nasopharyngeal Nasopharyngeal Swab     Status: None   Collection Time: 08/29/19 12:07 AM   Specimen: Nasopharyngeal Swab  Result Value Ref Range Status   SARS Coronavirus 2 NEGATIVE NEGATIVE Final    Comment: (NOTE) SARS-CoV-2 target nucleic acids are NOT DETECTED. The SARS-CoV-2 RNA is generally detectable in upper and lower respiratory specimens during the acute phase of infection. Negative results do not preclude SARS-CoV-2 infection, do not rule out co-infections with other pathogens, and should not be used as the sole basis for treatment or other patient management decisions. Negative results must be combined with clinical observations, patient history, and epidemiological information. The expected result is Negative. Fact Sheet for Patients: HairSlick.nohttps://www.fda.gov/media/138098/download Fact Sheet for Healthcare Providers: quierodirigir.comhttps://www.fda.gov/media/138095/download This test is not yet approved or cleared by the [place] FDA and  has been authorized for detection and/or diagnosis of SARS-CoV-2  by FDA under an Emergency Use Authorization (EUA). This EUA will remain  in effect (meaning this test can be used) for the duration of the COVID-19 declaration under Section 56 4(b)(1) of the Act, 21 U.S.C. section 360bbb-3(b)(1), unless the authorization is terminated or revoked sooner. Performed at [facility], [address] [place], [place] [address]          Radiology Studies: Ct Head Wo Contrast  Result Date: 08/29/2019 CLINICAL DATA:  Initial evaluation for acute seizure like activity. EXAM: CT HEAD WITHOUT CONTRAST TECHNIQUE: Contiguous axial images were obtained from the base of the skull through the vertex without intravenous contrast. COMPARISON:  None available. FINDINGS: Brain: Moderately advanced age-related cerebral atrophy. Patchy and confluent hypodensity within the periventricular deep white matter both cerebral hemispheres most consistent with chronic small vessel ischemic disease, moderate in nature. Few scattered remote lacunar infarcts noted within the bilateral basal ganglia and cerebellum. No acute intracranial hemorrhage. No acute large vessel territory infarct. No mass lesion, midline shift or mass effect. No hydrocephalus. No extra-axial fluid collection. Vascular: No hyperdense vessel. Calcified atherosclerosis at the skull base.  Skull: Scalp soft tissues and calvarium within normal limits. Sinuses/Orbits: Globes and orbital soft tissues demonstrate no acute finding. Paranasal sinuses and mastoid air cells are clear. Other: None. IMPRESSION: 1. No acute intracranial abnormality. 2. Moderately advanced age-related cerebral atrophy with chronic small vessel ischemic disease. Electronically Signed   By: Jeannine Boga M.D.   On: 08/29/2019 14:12   Dg Chest Port 1 View  Result Date: 08/29/2019 CLINICAL DATA:  Shortness of breath for 1 week EXAM: PORTABLE CHEST 1 VIEW COMPARISON:  None. FINDINGS: Cardiac shadow is at the upper limits of normal in size.  Aortic calcifications are noted. Calcified pleural plaques are noted. Small right pleural effusion is seen. Right basilar atelectatic changes are noted. No bony abnormality is seen. IMPRESSION: Right basilar atelectasis with small effusion. Bilateral calcified pleural plaques. Electronically Signed   By: Inez Catalina M.D.   On: 08/29/2019 00:21        Scheduled Meds: . Chlorhexidine Gluconate Cloth  6 each Topical Daily  . sodium chloride flush  10-40 mL Intracatheter Q12H  . sodium chloride flush  3 mL Intravenous Q12H   Continuous Infusions: . sodium chloride 10 mL/hr at 08/30/19 0600  . cefTRIAXone (ROCEPHIN)  IV 1 g (08/30/19 3149)  . levETIRAcetam Stopped (08/30/19 0057)    Assessment & Plan:   Active Problems:   Complete AV block (HCC)   Acute CHF (congestive heart failure) (HCC)   Essential hypertension   AKI (acute kidney injury) (HCC)   Elevated troponin I level   Acute respiratory failure with hypoxemia (HCC)   Complete heart block/AV block/symptomatic bradycardia -Was taking metoprolol tartrate 12.5 mg once a day at home, held here. -On temporary pacemaker with conduction  -EP/cardiology following they are planning to take this out today.  Patient did not want pacemaker placement.  Will monitor overnight.  Avoid AV nodal blockade's    Acute respiratory failure with hypoxemia- Was started on abx for cap- no evidence on cxr or clinically. Will d/c abx Likely 2/2 acute DHF Clinically improved Ck bnp, lasix prn Monitor closely   Elevated troponin -Secondary to increased demand ischemia in the setting of prolonged complete heart block.   -Echo nml EF - Cards following   Seizure-like activity-seizure vs convulsive syncope -Neurology input was appreciated-recommended Keppra 500 mg twice daily, however DC if seizure-like spells occurre while patient having heart block -We will monitor  Acute vs CKD- -Creatinine on admission 2.8, improving with temporary  pacing/perfusion. -Possibly some factor from hypoperfusion -We will obtain renal ultrasound -Avoid nephrotoxins -Monitor labs  Aortic stenosis -Mild by echo.  Acute diastolic heart failure -Exacerbated by prolonged bradycardia.   - was initially on facemask.  BNP elevated.    Leukocytosis-  Likely reactive/stress.  Afebrile and no signs of infection. Continue to monitor -Clinically has improved.   -Essential HTN- meds held on admission 2/2 #1. -currently stable, continue to monitor   DVT prophylaxis: will start heparin sq Code Status:full Family Communication: none at bedside Disposition Plan: If medically stable will d/c in 2 days.       LOS: 1 day   Time spent: 45 minutes with more than 50% on [place], MD [facility] Pager [phone]  If 7PM-7AM, please contact night-coverage [email or web address] Password [email or web address] 08/30/2019, 7:17 AM

## 2019-08-30 NOTE — Progress Notes (Signed)
   Reviewed tele.   Patient is conducting on his own.   He has an occasional paced beat after a conducted PAC with compensatory pause.  Reviewed with Dr. Curt Bears by phone.  Ok to DC wire and sheath.  PLAN: 1. Discontinue temporary pacer wire and sheath. 2. Continue to observe. Richardson Dopp, PA-C    08/30/2019 1:55 PM

## 2019-08-31 ENCOUNTER — Inpatient Hospital Stay (HOSPITAL_COMMUNITY): Payer: Medicare HMO

## 2019-08-31 ENCOUNTER — Inpatient Hospital Stay (HOSPITAL_COMMUNITY)
Admission: EM | Disposition: A | Discharge status: Home / Self Care | Payer: Self-pay | Source: Home / Self Care | Attending: Internal Medicine

## 2019-08-31 ENCOUNTER — Other Ambulatory Visit: Payer: Self-pay

## 2019-08-31 ENCOUNTER — Encounter (HOSPITAL_COMMUNITY): Payer: Self-pay | Admitting: Cardiovascular Disease

## 2019-08-31 DIAGNOSIS — N289 Disorder of kidney and ureter, unspecified: Secondary | ICD-10-CM

## 2019-08-31 DIAGNOSIS — I10 Essential (primary) hypertension: Secondary | ICD-10-CM

## 2019-08-31 DIAGNOSIS — E43 Unspecified severe protein-calorie malnutrition: Secondary | ICD-10-CM | POA: Insufficient documentation

## 2019-08-31 HISTORY — PX: PACEMAKER IMPLANT: EP1218

## 2019-08-31 LAB — COMPREHENSIVE METABOLIC PANEL
ALT: 37 U/L (ref 0–44)
AST: 25 U/L (ref 15–41)
Albumin: 2.8 g/dL — ABNORMAL LOW (ref 3.5–5.0)
Alkaline Phosphatase: 57 U/L (ref 38–126)
Anion gap: 13 (ref 5–15)
BUN: 51 mg/dL — ABNORMAL HIGH (ref 8–23)
CO2: 19 mmol/L — ABNORMAL LOW (ref 22–32)
Calcium: 8.4 mg/dL — ABNORMAL LOW (ref 8.9–10.3)
Chloride: 110 mmol/L (ref 98–111)
Creatinine, Ser: 2.18 mg/dL — ABNORMAL HIGH (ref 0.61–1.24)
GFR calc Af Amer: 32 mL/min — ABNORMAL LOW (ref >60)
GFR calc non Af Amer: 28 mL/min — ABNORMAL LOW (ref >60)
Glucose, Bld: 89 mg/dL (ref 70–99)
Potassium: 4.1 mmol/L (ref 3.5–5.1)
Sodium: 142 mmol/L (ref 135–145)
Total Bilirubin: 0.8 mg/dL (ref 0.3–1.2)
Total Protein: 7.2 g/dL (ref 6.5–8.1)

## 2019-08-31 LAB — GLUCOSE, CAPILLARY
Glucose-Capillary: 99 mg/dL (ref 70–99)
Glucose-Capillary: 111 mg/dL — ABNORMAL HIGH (ref 70–99)

## 2019-08-31 LAB — CBC
HCT: 34.2 % — ABNORMAL LOW (ref 39.0–52.0)
Hemoglobin: 11.4 g/dL — ABNORMAL LOW (ref 13.0–17.0)
MCH: 31 pg (ref 26.0–34.0)
MCHC: 33.3 g/dL (ref 30.0–36.0)
MCV: 92.9 fL (ref 80.0–100.0)
Platelets: 183 10*3/uL (ref 150–400)
RBC: 3.68 MIL/uL — ABNORMAL LOW (ref 4.22–5.81)
RDW: 14.3 % (ref 11.5–15.5)
WBC: 9.7 10*3/uL (ref 4.0–10.5)
nRBC: 0 % (ref 0.0–0.2)

## 2019-08-31 LAB — SURGICAL PCR SCREEN
MRSA, PCR: NEGATIVE
Staphylococcus aureus: POSITIVE — AB

## 2019-08-31 SURGERY — PACEMAKER IMPLANT

## 2019-08-31 MED ORDER — CEFAZOLIN SODIUM-DEXTROSE 1-4 GM/50ML-% IV SOLN: 1.0000 g | Freq: Four times a day (QID) | INTRAVENOUS | Status: DC

## 2019-08-31 MED ORDER — ONDANSETRON HCL 4 MG/2ML IJ SOLN: 4.0000 mg | Freq: Four times a day (QID) | INTRAMUSCULAR | Status: DC | PRN

## 2019-08-31 MED ORDER — ACETAMINOPHEN 325 MG PO TABS: 325.0000 mg | ORAL_TABLET | ORAL | Status: DC | PRN

## 2019-08-31 MED ORDER — DOPAMINE-DEXTROSE 3.2-5 MG/ML-% IV SOLN
INTRAVENOUS | Status: AC
  Filled 2019-08-31: qty 250

## 2019-08-31 MED ORDER — CEFAZOLIN SODIUM-DEXTROSE 2-4 GM/100ML-% IV SOLN
INTRAVENOUS | Status: AC
  Filled 2019-08-31: qty 100

## 2019-08-31 MED ORDER — MUPIROCIN 2 % EX OINT
TOPICAL_OINTMENT | CUTANEOUS | Status: AC
  Administered 2019-08-31: 08:00:00
  Filled 2019-08-31: qty 22

## 2019-08-31 MED ORDER — CEFAZOLIN SODIUM-DEXTROSE 1-4 GM/50ML-% IV SOLN
1.0000 g | Freq: Two times a day (BID) | INTRAVENOUS | Status: AC
  Administered 2019-08-31 – 2019-09-01 (×3): 1 g via INTRAVENOUS
  Filled 2019-08-31 (×5): qty 50

## 2019-08-31 MED ORDER — IOHEXOL 350 MG/ML SOLN
INTRAVENOUS | Status: DC | PRN
  Administered 2019-08-31: 10 mL

## 2019-08-31 MED ORDER — SODIUM CHLORIDE 0.9 % IV SOLN
INTRAVENOUS | Status: AC
  Filled 2019-08-31: qty 2

## 2019-08-31 MED ORDER — DOPAMINE-DEXTROSE 3.2-5 MG/ML-% IV SOLN
0.0000 ug/kg/min | INTRAVENOUS | Status: DC
  Administered 2019-08-31: 5 ug/kg/min via INTRAVENOUS

## 2019-08-31 MED ORDER — SODIUM CHLORIDE 0.9 % IV SOLN
INTRAVENOUS | Status: DC
  Administered 2019-08-31: 08:00:00 via INTRAVENOUS

## 2019-08-31 MED ORDER — HEPARIN (PORCINE) IN NACL 1000-0.9 UT/500ML-% IV SOLN
INTRAVENOUS | Status: AC
  Filled 2019-08-31: qty 500

## 2019-08-31 MED ORDER — SODIUM CHLORIDE 0.9 % IV SOLN
80.0000 mg | INTRAVENOUS | Status: AC
  Administered 2019-08-31: 80 mg

## 2019-08-31 MED ORDER — MUPIROCIN 2 % EX OINT
1.0000 "application " | TOPICAL_OINTMENT | Freq: Two times a day (BID) | CUTANEOUS | Status: DC
  Administered 2019-08-31 – 2019-09-03 (×6): 1 via NASAL
  Filled 2019-08-31 (×2): qty 22

## 2019-08-31 MED ORDER — LIDOCAINE HCL (PF) 1 % IJ SOLN
INTRAMUSCULAR | Status: AC
  Filled 2019-08-31: qty 60

## 2019-08-31 MED ORDER — LIDOCAINE HCL (PF) 1 % IJ SOLN
INTRAMUSCULAR | Status: DC | PRN
  Administered 2019-08-31: 60 mL

## 2019-08-31 MED ORDER — LORAZEPAM 2 MG/ML IJ SOLN
1.0000 mg | Freq: Once | INTRAMUSCULAR | Status: AC
  Administered 2019-08-31: 1 mg via INTRAVENOUS
  Filled 2019-08-31: qty 1

## 2019-08-31 MED ORDER — CEFAZOLIN SODIUM-DEXTROSE 2-4 GM/100ML-% IV SOLN
2.0000 g | INTRAVENOUS | Status: AC
  Administered 2019-08-31: 2 g via INTRAVENOUS

## 2019-08-31 MED ORDER — FUROSEMIDE 10 MG/ML IJ SOLN
20.0000 mg | Freq: Once | INTRAMUSCULAR | Status: AC
  Administered 2019-08-31: 20 mg via INTRAVENOUS
  Filled 2019-08-31: qty 2

## 2019-08-31 SURGICAL SUPPLY — 12 items
CABLE SURGICAL S-101-97-12 (CABLE) ×2 IMPLANT
CATH RIGHTSITE C315HIS02 (CATHETERS) ×2 IMPLANT
GUIDEWIRE ANGLED .035X150CM (WIRE) ×2 IMPLANT
IPG PACE AZUR XT DR MRI W1DR01 (Pacemaker) ×1 IMPLANT
LEAD CAPSURE NOVUS 5076-52CM (Lead) ×2 IMPLANT
LEAD SELECT SECURE 3830 383069 (Lead) ×1 IMPLANT
PACE AZURE XT DR MRI W1DR01 (Pacemaker) ×2 IMPLANT
PAD PRO RADIOLUCENT 2001M-C (PAD) IMPLANT
SELECT SECURE 3830 383069 (Lead) ×2 IMPLANT
SHEATH 7FR PRELUDE SNAP 13 (SHEATH) ×4 IMPLANT
SLITTER 6232ADJ (MISCELLANEOUS) ×2 IMPLANT
TRAY PACEMAKER INSERTION (PACKS) ×2 IMPLANT

## 2019-08-31 NOTE — Progress Notes (Addendum)
PROGRESS NOTE    Kenneth Matthews  FUX:[id number] DOB: 09/13/1941 DOA: 08/28/2019 PCP: Neale Burly, MD    Brief Narrative:  Kenneth Matthews a 78 y.o.malewith medical history significant ofhypertension on amlodipine and metoprolol presented with SOB. EMS found him in complete AV block.Had elevated BNP and TP.  While as inpatient he had several episodes of seizure-like activity and neurology was consulted.    Consultants:   Cardiology ,neurology  Procedures:  Temporary pacemaker -08/29/19  Echo : 08/29/19 1. Left ventricular ejection fraction, by visual estimation, is 65 to 70%. The left ventricle has normal function. There is severely increased left ventricular hypertrophy. 2. Left ventricular diastolic parameters are indeterminate. 3. Global right ventricle was not well visualized.The right ventricular size is not well visualized. Right vetricular wall thickness was not assessed. 4. Left atrial size was mildly dilated. 5. Right atrial size was normal. 6. Moderate thickening of the mitral valve leaflet(s). 7. Moderate to severe mitral annular calcification. 8. The mitral valve is abnormal. Mild mitral valve regurgitation. Moderate mitral stenosis. 9. MV peak gradient, 16.8 mmHg. 10. The tricuspid valve is grossly normal. Tricuspid valve regurgitation is trivial. 11. Aortic valve area, by VTI measures 1.90 cm. 12. Aortic valve mean gradient measures 12.0 mmHg. 13. Aortic valve peak gradient measures 21.5 mmHg. 14. The aortic valve is tricuspid. Aortic valve regurgitation is trivial. Mild aortic valve stenosis. 15. The pulmonic valve was grossly normal. Pulmonic valve regurgitation is not visualized. 16. The inferior vena cava is normal in size with greater than 50% respiratory variability, suggesting right atrial pressure of 3 mmHg.  Renal US: 11/15 1. Increased echogenicity within the renal parenchyma, compatible with medical renal disease. No  hydronephrosis. 2. Enlarged prostate. Mild circumferential bladder wall thickening likely related to incomplete distension and/or chronic outlet obstruction. 3. Simple bilateral renal cysts as above.   Antimicrobials:   ceftrixone  Subjective: Back from ppm placement. Wife at bedside. Pt sleepy, denies pain or sob. Earlier this am with witnessed syncopal episode, cards was notified..then pt taken for ppm placment  Objective: Vitals:   08/31/19 0420 08/31/19 0430 08/31/19 0440 08/31/19 0450  BP:      Pulse: 85 92 88 88  Resp: (!) 33 (!) 23 (!) 22 20  Temp:      TempSrc:      SpO2: 93% 96% 97% 97%  Weight:      Height:        Intake/Output Summary (Last 24 hours) at 08/31/2019 0725 Last data filed at 08/31/2019 0400 Gross per 24 hour  Intake 172.05 ml  Output 1150 ml  Net -977.95 ml   Filed Weights   08/29/19 0508 08/30/19 0630 08/31/19 0400  Weight: 55.7 kg 55.2 kg 55.7 kg    Examination: General exam: Appears calm and comfortable , sleepy Respiratory system: Clear to auscultation with poor resp effort, no w/r/r Cardiovascular system: S1 & S2 heard, RRR. No JVD, +2/6SEM, no gallop Left chest: ppm placement , dressing on it Gastrointestinal system: Abdomen is nondistended, soft and nontender. Normal bowel sounds heard. Central nervous system: awakens, sleepy. No focal neurological deficits obvious. Extremities: no edema b/l Skin: warm , dry Psychiatry:  Mood & affect appropriate in currently setting   Data Reviewed: I have personally reviewed following labs and imaging studies  CBC: Recent Labs  Lab 08/28/19 2317 08/29/19 0433 08/30/19 0233 08/31/19 0234  WBC 8.6  --  13.9* 9.7  NEUTROABS 7.8*  --   --   --  HGB 12.5* 11.2* 11.3* 11.4*  HCT 38.7* 33.0* 33.7* 34.2*  MCV 95.1  --  91.8 92.9  PLT 200  --  190 183   Basic Metabolic Panel: Recent Labs  Lab 08/28/19 2317 08/29/19 0433 08/30/19 0233 08/31/19 0234  NA 135 137 139 142  K 4.8 5.0 4.1  4.1  CL 104  --  108 110  CO2 19*  --  19* 19*  GLUCOSE 145*  --  109* 89  BUN 45*  --  52* 51*  CREATININE 2.82*  --  2.65* 2.18*  CALCIUM 8.9  --  8.2* 8.4*  MG 1.9  --   --   --    GFR: Estimated Creatinine Clearance: 21.6 mL/min (A) (by C-G formula based on SCr of 2.18 mg/dL (H)). Liver Function Tests: Recent Labs  Lab 08/30/19 0233 08/31/19 0234  AST 37 25  ALT 43 37  ALKPHOS 64 57  BILITOT 0.9 0.8  PROT 7.2 7.2  ALBUMIN 2.9* 2.8*   No results for input(s): LIPASE, AMYLASE in the last 168 hours. No results for input(s): AMMONIA in the last 168 hours. Coagulation Profile: No results for input(s): INR, PROTIME in the last 168 hours. Cardiac Enzymes: No results for input(s): CKTOTAL, CKMB, CKMBINDEX, TROPONINI in the last 168 hours. BNP (last 3 results) No results for input(s): PROBNP in the last 8760 hours. HbA1C: No results for input(s): HGBA1C in the last 72 hours. CBG: No results for input(s): GLUCAP in the last 168 hours. Lipid Profile: No results for input(s): CHOL, HDL, LDLCALC, TRIG, CHOLHDL, LDLDIRECT in the last 72 hours. Thyroid Function Tests: No results for input(s): TSH, T4TOTAL, FREET4, T3FREE, THYROIDAB in the last 72 hours. Anemia Panel: No results for input(s): VITAMINB12, FOLATE, FERRITIN, TIBC, IRON, RETICCTPCT in the last 72 hours. Sepsis Labs: No results for input(s): PROCALCITON, LATICACIDVEN in the last 168 hours.  Recent Results (from the past 240 hour(s))  SARS CORONAVIRUS 2 (TAT 6-24 HRS) Nasopharyngeal Nasopharyngeal Swab     Status: None   Collection Time: 08/29/19 12:07 AM   Specimen: Nasopharyngeal Swab  Result Value Ref Range Status   SARS Coronavirus 2 NEGATIVE NEGATIVE Final    Comment: (NOTE) SARS-CoV-2 target nucleic acids are NOT DETECTED. The SARS-CoV-2 RNA is generally detectable in upper and lower respiratory specimens during the acute phase of infection. Negative results do not preclude SARS-CoV-2 infection, do not rule  out co-infections with other pathogens, and should not be used as the sole basis for treatment or other patient management decisions. Negative results must be combined with clinical observations, patient history, and epidemiological information. The expected result is Negative. Fact Sheet for Patients: HairSlick.no Fact Sheet for Healthcare Providers: quierodirigir.com This test is not yet approved or cleared by the [place] FDA and  has been authorized for detection and/or diagnosis of SARS-CoV-2 by FDA under an Emergency Use Authorization (EUA). This EUA will remain  in effect (meaning this test can be used) for the duration of the COVID-19 declaration under Section 56 4(b)(1) of the Act, 21 U.S.C. section 360bbb-3(b)(1), unless the authorization is terminated or revoked sooner. Performed at [facility], [address], [place], [place] [address]          Radiology Studies: Ct Head Wo Contrast  Result Date: 08/29/2019 CLINICAL DATA:  Initial evaluation for acute seizure like activity. EXAM: CT HEAD WITHOUT CONTRAST TECHNIQUE: Contiguous axial images were obtained from the base of the skull through the vertex without intravenous contrast. COMPARISON:  None available. FINDINGS: Brain: Moderately advanced age-related cerebral atrophy. Patchy and confluent hypodensity within the periventricular deep white matter both cerebral hemispheres most consistent with chronic small vessel ischemic disease, moderate in nature. Few scattered remote lacunar infarcts noted within the bilateral basal ganglia and cerebellum. No acute intracranial hemorrhage. No acute large vessel territory infarct. No mass lesion, midline shift or mass effect. No hydrocephalus. No extra-axial fluid collection. Vascular: No hyperdense vessel. Calcified atherosclerosis at the skull base. Skull: Scalp soft tissues and calvarium within normal limits.  Sinuses/Orbits: Globes and orbital soft tissues demonstrate no acute finding. Paranasal sinuses and mastoid air cells are clear. Other: None. IMPRESSION: 1. No acute intracranial abnormality. 2. Moderately advanced age-related cerebral atrophy with chronic small vessel ischemic disease. Electronically Signed   By: Rise Mu M.D.   On: 08/29/2019 14:12   US Renal  Result Date: 08/30/2019 CLINICAL DATA:  Initial evaluation for acute renal failure. EXAM: RENAL / URINARY TRACT ULTRASOUND COMPLETE COMPARISON:  None. FINDINGS: Right Kidney: Renal measurements: 10.0 x 3.7 x 4.5 cm = volume: 85.4 mL. Increased echogenicity within the renal parenchyma, compatible with medical renal disease. No nephrolithiasis or hydronephrosis. 1.5 x 1.1 x 1.2 cm simple cyst present at the lower pole. Additional 0.9 x 0.7 x 0.8 cm exophytic simple cyst present at the interpolar region. Left Kidney: Renal measurements: 9.1 x 4.8 x 5.0 cm = volume: 112.5 mL. Increased echogenicity within the renal parenchyma, compatible with medical renal disease. No nephrolithiasis or hydronephrosis. 3.2 x 2.6 x 3.3 cm simple cyst present at the mid-upper pole. Bladder: Mild bladder wall thickening likely related incomplete distension and/or chronic outlet obstruction. Bilateral ureteral jets are visualized. Other: Enlarged and somewhat nodular prostate with volume of 36.9 cc noted. IMPRESSION: 1. Increased echogenicity within the renal parenchyma, compatible with medical renal disease. No hydronephrosis. 2. Enlarged prostate. Mild circumferential bladder wall thickening likely related to incomplete distension and/or chronic outlet obstruction. 3. Simple bilateral renal cysts as above. Electronically Signed   By: Rise Mu M.D.   On: 08/30/2019 14:26        Scheduled Meds: . Chlorhexidine Gluconate Cloth  6 each Topical Daily  . furosemide  20 mg Intravenous Once  . sodium chloride flush  3 mL Intravenous Q12H    Continuous Infusions: . sodium chloride Stopped (08/30/19 1412)  . DOPamine    . DOPamine 5 mcg/kg/min (08/31/19 0654)  . levETIRAcetam 500 mg (08/31/19 0053)    Assessment & Plan:   Active Problems:   Third degree heart block (HCC)   Acute CHF (congestive heart failure) (HCC)   Essential hypertension   AKI (acute kidney injury) (HCC)   Elevated troponin I level   Acute respiratory failure with hypoxemia (HCC)   Renal insufficiency   Acute diastolic CHF (congestive heart failure) (HCC)  Complete heart block/AV block/symptomatic bradycardia -Was taking metoprolol tartrate 12.5 mg once a day at home, held here. -was on  temporary pacemaker with conduction , when removed, pt had witnessed syncoope...> now s/p PPM this am.   Acute respiratory failure with hypoxemia- Was started on abx for cap- no evidence on cxr or clinically. Will d/c abx Likely 2/2 acute DHF Clinically improved Ck bnp, lasix prn Monitor closely   Elevated troponin -Secondary to increased demand ischemia in the setting of prolonged complete heart block.  -Echo nml EF - Cards following   Seizure-like activity-seizure vs convulsive syncope -Neurology input was appreciated-recommended Keppra 500 mg twice daily, however DC if seizure-like spells occurre while  patient having heart block EEG no seizure activity -11/16 per cards  "possible seizure like activity: from pauses and hypoperfusion.  Will d/w neurology if d/c keppra?  Acute vs CKD- -Creatinine on admission 2.8, improving with temporary pacer/hyperfusion. Hopefully will continue to improve s/p ppm Ck am labs  Renal us see above results    Aortic stenosis -Mild by echo.  Acute diastolic heart failure -Exacerbated by prolonged bradycardia.  - was initially on facemask.  BNP elevated. -now better, post lasix  -monitor    Leukocytosis-  Likely reactive/stress.  Afebrile and no signs of infection. Continue to  monitor -Clinically has improved.   -Essential HTN- meds held on admission 2/2 #1. -currently stable, continue to monitor   DVT prophylaxis: will start heparin sq Code Status:full Family Communication: none at bedside Disposition Plan: If medically stable will d/c in 1-2 days. Pt will have barium swallow        LOS: 2 days   Time spent: 45 minutes with more than 50% on COC    Lynn ItoSahar Tuleen Mandelbaum, MD [facility] Pager [phone]  If 7PM-7AM, please contact night-coverage [email or web address] Password [email or web address] 08/31/2019, 7:25 AM prog

## 2019-08-31 NOTE — Progress Notes (Signed)
Contacted Infection Prevention to confirm that Mr. Mittleman does not require contact precautions.

## 2019-08-31 NOTE — Progress Notes (Signed)
Attempted to contact Mr. Salsgiver significant other through the only number we have on file. I was unable to reach her to obtain consent and additional contact information.

## 2019-08-31 NOTE — Progress Notes (Signed)
Orthopedic Tech Progress Note Patient Details:  Kenneth Matthews Oct 24, 1940 [id number] Patient already has arm sling. Patient ID: CLARK CUFF, male   DOB: 01/18/1941, 78 y.o.   MRN: [id number]   Braulio Bosch 08/31/2019, 1:16 PM

## 2019-08-31 NOTE — Progress Notes (Signed)
Initial Nutrition Assessment  DOCUMENTATION CODES:   Underweight, Severe malnutrition in context of chronic illness  INTERVENTION:   -RD to provide supplements after MBS result.  NUTRITION DIAGNOSIS:   Severe Malnutrition related to dysphagia, chronic illness as evidenced by energy intake < or equal to 75% for > or equal to 1 month, severe muscle depletion, severe fat depletion.  GOAL:   Patient will meet greater than or equal to 90% of their needs  MONITOR:   PO intake, Supplement acceptance, Diet advancement, Labs, Weight trends  REASON FOR ASSESSMENT:   Other (Comment)(Low BMI)   ASSESSMENT:   78 year old pt admitted with shortness of breath and weakness with PMH of HTN. Pt found to have 3rd degree heart block, pacemaker placement 08/31/19.  Pt was in bed sleeping and his wife Janeann Forehand was at bedside. Pts wife does the cooking. Said the pt has had difficulties swallowing for years. She stated it has progressed from being difficult to swallow to the pt coughing up and spitting out food about 6-8 months ago. She said the pt typically eats a breakfast of cereal with sliced banana, lunch of a hot dog which he eats about half and a dinner of spaghetti with red sauce. Wife mentioned the pt stopped eating stringy meats and does better with foods that are moist or with gravy.   SLP has been consulted with MBS possibly 09/01/19.  There is not a wt history in the chart and the pts wife was unsure of a typical wt. Wife denied wt loss and said pts clothes fit about the same. Patient currently NPO.  Will follow pt and provide supplements as diet advances.   Medications reviewed: Ancef 1g/88ml premix, morphine 2mg   Labs reviewed: Potassium 4.1  NUTRITION - FOCUSED PHYSICAL EXAM:    Most Recent Value  Orbital Region  Severe depletion  Upper Arm Region  Severe depletion  Thoracic and Lumbar Region  Severe depletion  Buccal Region  Severe depletion  Temple Region  Severe depletion   Clavicle Bone Region  Severe depletion  Clavicle and Acromion Bone Region  Severe depletion  Scapular Bone Region  Severe depletion  Dorsal Hand  Moderate depletion  Patellar Region  Severe depletion  Anterior Thigh Region  Severe depletion  Posterior Calf Region  Severe depletion  Edema (RD Assessment)  None  Hair  Reviewed  Eyes  Reviewed  Mouth  Reviewed  Skin  Reviewed  Nails  Reviewed       Diet Order:   Diet Order            Diet NPO time specified  Diet effective now              EDUCATION NEEDS:   Not appropriate for education at this time  Skin:  Skin Assessment: Reviewed RN Assessment  Last BM:  11/15  Height:   Ht Readings from Last 1 Encounters:  08/29/19 5\' 11"  (1.803 m)    Weight:   Wt Readings from Last 1 Encounters:  08/31/19 55.7 kg    Ideal Body Weight:  78.2 kg  BMI:  Body mass index is 17.13 kg/m.  Estimated Nutritional Needs:   Kcal:  1700-1900  Protein:  85-95  Fluid:  1.7-1.9L    Allen Norris Dietetic Intern Pager # [phone]

## 2019-08-31 NOTE — Progress Notes (Signed)
Nurse described shingles area as dry and crusted over. Okay to discontinue contact isolation.

## 2019-08-31 NOTE — Progress Notes (Signed)
SLP Cancellation Note  Patient Details Name: Kenneth Matthews MRN: [id number] DOB: [birth year]   Cancelled treatment:       Reason Eval/Treat Not Completed: Medical issues which prohibited therapy. Talked to RN who said that pt was sedated, going to cath lab soon. He is not appropriate for MBS at this time. RN suggests waiting until next date. Will f/u for readiness.   Venita Sheffield Kenneth Matthews 08/31/2019, 8:18 AM  Pollyann Glen, M.A. [place] Acute Environmental education officer [phone] Office [phone]

## 2019-08-31 NOTE — Discharge Instructions (Signed)
° ° °  Supplemental Discharge Instructions for  °Pacemaker/Defibrillator Patients ° °Activity °No heavy lifting or vigorous activity with your left/right arm for 6 to 8 weeks.  Do not raise your left/right arm above your head for one week.  Gradually raise your affected arm as drawn below. ° °        ° °__        11/20                     11/21                     11/22                       11/23 ° ° °WOUND CARE °- Keep the wound area clean and dry.  Do not get this area wet for one week. No showers for one week; you may shower on 11/23   . °- The tape/steri-strips on your wound will fall off; do not pull them off.  No bandage is needed on the site.  DO  NOT apply any creams, oils, or ointments to the wound area. °- If you notice any drainage or discharge from the wound, any swelling or bruising at the site, or you develop a fever > 101? F after you are discharged home, call the office at once. ° °Special Instructions °- You are still able to use cellular telephones; use the ear opposite the side where you have your pacemaker/defibrillator.  Avoid carrying your cellular phone near your device. °- When traveling through airports, show security personnel your identification card to avoid being screened in the metal detectors.  Ask the security personnel to use the hand wand. °- Avoid arc welding equipment, MRI testing (magnetic resonance imaging), TENS units (transcutaneous nerve stimulators).  Call the office for questions about other devices. °- Avoid electrical appliances that are in poor condition or are not properly grounded. °- Microwave ovens are safe to be near or to operate. °  °

## 2019-08-31 NOTE — Progress Notes (Addendum)
Progress Note  Patient Name: Kenneth Matthews Date of Encounter: 08/31/2019  Primary Cardiologist: Candee Furbish, MD   Subjective   At approximately 630 was called by nursing staff after witnessed syncopal/loss of consciousness episode that correlated with telemetry demonstrating P waves without any QRS conduction.  Prior to that he was sinus rhythm 94 bpm.  See below for details  Inpatient Medications    Scheduled Meds: . Chlorhexidine Gluconate Cloth  6 each Topical Daily  . furosemide  20 mg Intravenous Once  . gentamicin irrigation  80 mg Irrigation To Cath  . sodium chloride flush  3 mL Intravenous Q12H   Continuous Infusions: . sodium chloride Stopped (08/30/19 1412)  . sodium chloride 50 mL/hr at 08/31/19 0815  .  ceFAZolin (ANCEF) IV Stopped (08/31/19 0827)  . DOPamine Stopped (08/31/19 0725)  . levETIRAcetam 500 mg (08/31/19 0053)   PRN Meds: sodium chloride, Melatonin, morphine injection, sodium chloride flush   Vital Signs    Vitals:   08/31/19 0430 08/31/19 0440 08/31/19 0450 08/31/19 0600  BP:    (!) 152/70  Pulse: 92 88 88 87  Resp: (!) 23 (!) 22 20 16   Temp:      TempSrc:      SpO2: 96% 97% 97% 98%  Weight:      Height:        Intake/Output Summary (Last 24 hours) at 08/31/2019 0828 Last data filed at 08/31/2019 0400 Gross per 24 hour  Intake 162.05 ml  Output 1150 ml  Net -987.95 ml   Last 3 Weights 08/31/2019 08/30/2019 08/29/2019  Weight (lbs) 122 lb 12.7 oz 121 lb 11.1 oz 122 lb 12.7 oz  Weight (kg) 55.7 kg 55.2 kg 55.7 kg      Telemetry    Occasional beats of ventricular pacing in succession 5-7 beats, this morning sinus rhythm with normal-appearing PR interval.  Yesterday complete heart block heart rate as low as 24 bpm- Personally Reviewed  ECG    11/14-complete heart block- Personally Reviewed  Physical Exam   GEN: Thin, laying in bed Neck: No JVD nasal cannula oxygen Cardiac: RRR, 2/6 systolic murmur, rubs, or gallops.   Respiratory: Clear to auscultation bilaterally. GI: Soft, nontender, non-distended  MS: No edema; No deformity. Neuro:  Nonfocal  Psych: Normal affect  Pacer pads in place  Labs    High Sensitivity Troponin:   Recent Labs  Lab 08/28/19 2317 08/29/19 0200 08/29/19 0529 08/29/19 0711  TROPONINIHS 869* 778* 1,306* 1,583*      Chemistry Recent Labs  Lab 08/28/19 2317 08/29/19 0433 08/30/19 0233 08/31/19 0234  NA 135 137 139 142  K 4.8 5.0 4.1 4.1  CL 104  --  108 110  CO2 19*  --  19* 19*  GLUCOSE 145*  --  109* 89  BUN 45*  --  52* 51*  CREATININE 2.82*  --  2.65* 2.18*  CALCIUM 8.9  --  8.2* 8.4*  PROT  --   --  7.2 7.2  ALBUMIN  --   --  2.9* 2.8*  AST  --   --  37 25  ALT  --   --  43 37  ALKPHOS  --   --  64 57  BILITOT  --   --  0.9 0.8  GFRNONAA 20*  --  22* 28*  GFRAA 24*  --  25* 32*  ANIONGAP 12  --  12 13     Hematology Recent Labs  Lab 08/28/19 2317  08/29/19 0433 08/30/19 0233 08/31/19 0234  WBC 8.6  --  13.9* 9.7  RBC 4.07*  --  3.67* 3.68*  HGB 12.5* 11.2* 11.3* 11.4*  HCT 38.7* 33.0* 33.7* 34.2*  MCV 95.1  --  91.8 92.9  MCH 30.7  --  30.8 31.0  MCHC 32.3  --  33.5 33.3  RDW 14.4  --  14.1 14.3  PLT 200  --  190 183    BNP Recent Labs  Lab 08/28/19 2317 08/30/19 0233  BNP 1,035.9* 1,100.6*     DDimer No results for input(s): DDIMER in the last 168 hours.   Radiology    Ct Head Wo Contrast  Result Date: 08/29/2019 CLINICAL DATA:  Initial evaluation for acute seizure like activity. EXAM: CT HEAD WITHOUT CONTRAST TECHNIQUE: Contiguous axial images were obtained from the base of the skull through the vertex without intravenous contrast. COMPARISON:  None available. FINDINGS: Brain: Moderately advanced age-related cerebral atrophy. Patchy and confluent hypodensity within the periventricular deep white matter both cerebral hemispheres most consistent with chronic small vessel ischemic disease, moderate in nature. Few scattered  remote lacunar infarcts noted within the bilateral basal ganglia and cerebellum. No acute intracranial hemorrhage. No acute large vessel territory infarct. No mass lesion, midline shift or mass effect. No hydrocephalus. No extra-axial fluid collection. Vascular: No hyperdense vessel. Calcified atherosclerosis at the skull base. Skull: Scalp soft tissues and calvarium within normal limits. Sinuses/Orbits: Globes and orbital soft tissues demonstrate no acute finding. Paranasal sinuses and mastoid air cells are clear. Other: None. IMPRESSION: 1. No acute intracranial abnormality. 2. Moderately advanced age-related cerebral atrophy with chronic small vessel ischemic disease. Electronically Signed   By: Rise Mu M.D.   On: 08/29/2019 14:12   US Renal  Result Date: 08/30/2019 CLINICAL DATA:  Initial evaluation for acute renal failure. EXAM: RENAL / URINARY TRACT ULTRASOUND COMPLETE COMPARISON:  None. FINDINGS: Right Kidney: Renal measurements: 10.0 x 3.7 x 4.5 cm = volume: 85.4 mL. Increased echogenicity within the renal parenchyma, compatible with medical renal disease. No nephrolithiasis or hydronephrosis. 1.5 x 1.1 x 1.2 cm simple cyst present at the lower pole. Additional 0.9 x 0.7 x 0.8 cm exophytic simple cyst present at the interpolar region. Left Kidney: Renal measurements: 9.1 x 4.8 x 5.0 cm = volume: 112.5 mL. Increased echogenicity within the renal parenchyma, compatible with medical renal disease. No nephrolithiasis or hydronephrosis. 3.2 x 2.6 x 3.3 cm simple cyst present at the mid-upper pole. Bladder: Mild bladder wall thickening likely related incomplete distension and/or chronic outlet obstruction. Bilateral ureteral jets are visualized. Other: Enlarged and somewhat nodular prostate with volume of 36.9 cc noted. IMPRESSION: 1. Increased echogenicity within the renal parenchyma, compatible with medical renal disease. No hydronephrosis. 2. Enlarged prostate. Mild circumferential bladder  wall thickening likely related to incomplete distension and/or chronic outlet obstruction. 3. Simple bilateral renal cysts as above. Electronically Signed   By: Rise Mu M.D.   On: 08/30/2019 14:26    Cardiac Studies   ECHO 08/29/2019:   1. Left ventricular ejection fraction, by visual estimation, is 65 to 70%. The left ventricle has normal function. There is severely increased left ventricular hypertrophy.  2. Left ventricular diastolic parameters are indeterminate.  3. Global right ventricle was not well visualized.The right ventricular size is not well visualized. Right vetricular wall thickness was not assessed.  4. Left atrial size was mildly dilated.  5. Right atrial size was normal.  6. Moderate thickening of the mitral valve leaflet(s).  7.  Moderate to severe mitral annular calcification.  8. The mitral valve is abnormal. Mild mitral valve regurgitation. Moderate mitral stenosis.  9. MV peak gradient, 16.8 mmHg. 10. The tricuspid valve is grossly normal. Tricuspid valve regurgitation is trivial. 11. Aortic valve area, by VTI measures 1.90 cm. 12. Aortic valve mean gradient measures 12.0 mmHg. 13. Aortic valve peak gradient measures 21.5 mmHg. 14. The aortic valve is tricuspid. Aortic valve regurgitation is trivial. Mild aortic valve stenosis. 15. The pulmonic valve was grossly normal. Pulmonic valve regurgitation is not visualized. 16. The inferior vena cava is normal in size with greater than 50% respiratory variability, suggesting right atrial pressure of 3 mmHg.  Patient Profile     78 y.o. male with complete heart block, acute kidney injury, seizure-like activity-now seems to correlate with significant pauses  Assessment & Plan    Complete heart block/AV block/symptomatic bradycardia -Was taking metoprolol tartrate 12.5 mg once a day at home. -Has not had a dose for >48 hours. -Yesterday morning intermittently pacing but conducting at a heart rate of  approximately 80 bpm.  Earlier conducting at atrial tachycardia 124 bpm which was his underlying rhythm prior to temp wire. -Dr. Elberta Fortisamnitz with EP consulted over the weekend. -This morning, several nonconducted P waves were noted at 6:35 AM which correlated with his "seizure-like activity "loss of consciousness.  He has been doing this at home.  Dr. Elberta Fortisamnitz was called and appreciate Amber and Dr. Ladona Ridgelaylor seeing expeditiously this a.m. for permanent pacemaker placement.-Dopamine was added then he became tachycardic and hypertensive.  This was discontinued.  Transcutaneous pacing is currently capturing transiently when necessary.  Verified by pulse. -Previously on arrival, QRS conduction rate was ranging between 24 to 44 bpm with underlying atrial tachycardia.  Temp wire was placed over the weekend.   Elevated troponin -1500 high-sensitivity troponin likely secondary to increased demand ischemia in the setting of prolonged complete heart block.  Increases overall risk.  No changes   Seizure-like activity -Neurology has seen.  EEG performed.  I think it is clear now that these "seizure-like activity "were lengthy pauses resulting in hypoperfusion. Probably OK to DC KEPPRA if ok with neuro.   Acute kidney injury -Creatinine is 2.1 from 2.65.  Improvement from 2.8 on admission.  Aortic stenosis -Mild  Acute diastolic heart failure -Exacerbated by prolonged bradycardia.  Improved.  He is off of facemask oxygen.  Presumed shingles lower extremity -On contact  CRITICAL CARE Performed by: Donato SchultzMark    Total critical care time: 40 minutes  Critical care time was exclusive of separately billable procedures and treating other patients.  Critical care was necessary to treat or prevent imminent or life-threatening deterioration.  Critical care was time spent personally by me on the following activities: development of treatment plan with patient and/or surrogate as well as nursing, discussions  with consultants, evaluation of patient's response to treatment, examination of patient, obtaining history from patient or surrogate, ordering and performing treatments and interventions, ordering and review of laboratory studies, ordering and review of radiographic studies, pulse oximetry and re-evaluation of patient's condition.       For questions or updates, please contact [facility] Please consult [email or web address] for contact info under        Signed, Donato SchultzMark , MD  08/31/2019, 8:28 AM

## 2019-08-31 NOTE — Progress Notes (Signed)
   Able to speak with significant other. She is amenable with pacemaker. Updated her on situation.   Candee Furbish, MD

## 2019-08-31 NOTE — Progress Notes (Addendum)
Electrophysiology Rounding Note  Patient Name: Kenneth Matthews Date of Encounter: 08/31/2019  Primary Cardiologist: new to [facility] Electrophysiologist: Lovena Le   Subjective   The patient is sedated today 2/2 need for transcutaneous pacing. Temp wire pulled last night with multiple pauses up to 6 seconds this morning.  Dopamine started but had to be discontinued for tachycardia and hypertension.   Inpatient Medications    Scheduled Meds: . Chlorhexidine Gluconate Cloth  6 each Topical Daily  . furosemide  20 mg Intravenous Once  . sodium chloride flush  3 mL Intravenous Q12H   Continuous Infusions: . sodium chloride Stopped (08/30/19 1412)  . DOPamine    . DOPamine Stopped (08/31/19 0725)  . levETIRAcetam 500 mg (08/31/19 0053)   PRN Meds: sodium chloride, Melatonin, morphine injection, sodium chloride flush   Vital Signs    Vitals:   08/31/19 0420 08/31/19 0430 08/31/19 0440 08/31/19 0450  BP:      Pulse: 85 92 88 88  Resp: (!) 33 (!) 23 (!) 22 20  Temp:      TempSrc:      SpO2: 93% 96% 97% 97%  Weight:      Height:        Intake/Output Summary (Last 24 hours) at 08/31/2019 0734 Last data filed at 08/31/2019 0400 Gross per 24 hour  Intake 172.05 ml  Output 1150 ml  Net -977.95 ml   Filed Weights   08/29/19 0508 08/30/19 0630 08/31/19 0400  Weight: 55.7 kg 55.2 kg 55.7 kg    Physical Exam    GEN- The patient is elderly and frail appearing, alert and oriented x 3 today.   Head- normocephalic, atraumatic Eyes-  Sclera clear, conjunctiva pink Ears- hearing intact Oropharynx- clear Neck- supple Lungs- Clear to ausculation bilaterally, normal work of breathing Heart- Tachycardic regular rate and rhythm  GI- soft, NT, ND, + BS Extremities- no clubbing, cyanosis, or edema Skin- no rash or lesion Psych- euthymic mood, full affect Neuro- strength and sensation are intact  Labs    CBC Recent Labs    08/28/19 2317  08/30/19 0233 08/31/19 0234   WBC 8.6  --  13.9* 9.7  NEUTROABS 7.8*  --   --   --   HGB 12.5*   < > 11.3* 11.4*  HCT 38.7*   < > 33.7* 34.2*  MCV 95.1  --  91.8 92.9  PLT 200  --  190 183   < > = values in this interval not displayed.   Basic Metabolic Panel Recent Labs    08/28/19 2317  08/30/19 0233 08/31/19 0234  NA 135   < > 139 142  K 4.8   < > 4.1 4.1  CL 104  --  108 110  CO2 19*  --  19* 19*  GLUCOSE 145*  --  109* 89  BUN 45*  --  52* 51*  CREATININE 2.82*  --  2.65* 2.18*  CALCIUM 8.9  --  8.2* 8.4*  MG 1.9  --   --   --    < > = values in this interval not displayed.   Liver Function Tests Recent Labs    08/30/19 0233 08/31/19 0234  AST 37 25  ALT 43 37  ALKPHOS 64 57  BILITOT 0.9 0.8  PROT 7.2 7.2  ALBUMIN 2.9* 2.8*     Telemetry    V pacing at 70 with intermittent tachycardia in the 130's (personally reviewed)  Radiology    Ct Head  Wo Contrast  Result Date: 08/29/2019 CLINICAL DATA:  Initial evaluation for acute seizure like activity. EXAM: CT HEAD WITHOUT CONTRAST TECHNIQUE: Contiguous axial images were obtained from the base of the skull through the vertex without intravenous contrast. COMPARISON:  None available. FINDINGS: Brain: Moderately advanced age-related cerebral atrophy. Patchy and confluent hypodensity within the periventricular deep white matter both cerebral hemispheres most consistent with chronic small vessel ischemic disease, moderate in nature. Few scattered remote lacunar infarcts noted within the bilateral basal ganglia and cerebellum. No acute intracranial hemorrhage. No acute large vessel territory infarct. No mass lesion, midline shift or mass effect. No hydrocephalus. No extra-axial fluid collection. Vascular: No hyperdense vessel. Calcified atherosclerosis at the skull base. Skull: Scalp soft tissues and calvarium within normal limits. Sinuses/Orbits: Globes and orbital soft tissues demonstrate no acute finding. Paranasal sinuses and mastoid air cells are  clear. Other: None. IMPRESSION: 1. No acute intracranial abnormality. 2. Moderately advanced age-related cerebral atrophy with chronic small vessel ischemic disease. Electronically Signed   By: Rise Mu M.D.   On: 08/29/2019 14:12   US Renal  Result Date: 08/30/2019 CLINICAL DATA:  Initial evaluation for acute renal failure. EXAM: RENAL / URINARY TRACT ULTRASOUND COMPLETE COMPARISON:  None. FINDINGS: Right Kidney: Renal measurements: 10.0 x 3.7 x 4.5 cm = volume: 85.4 mL. Increased echogenicity within the renal parenchyma, compatible with medical renal disease. No nephrolithiasis or hydronephrosis. 1.5 x 1.1 x 1.2 cm simple cyst present at the lower pole. Additional 0.9 x 0.7 x 0.8 cm exophytic simple cyst present at the interpolar region. Left Kidney: Renal measurements: 9.1 x 4.8 x 5.0 cm = volume: 112.5 mL. Increased echogenicity within the renal parenchyma, compatible with medical renal disease. No nephrolithiasis or hydronephrosis. 3.2 x 2.6 x 3.3 cm simple cyst present at the mid-upper pole. Bladder: Mild bladder wall thickening likely related incomplete distension and/or chronic outlet obstruction. Bilateral ureteral jets are visualized. Other: Enlarged and somewhat nodular prostate with volume of 36.9 cc noted. IMPRESSION: 1. Increased echogenicity within the renal parenchyma, compatible with medical renal disease. No hydronephrosis. 2. Enlarged prostate. Mild circumferential bladder wall thickening likely related to incomplete distension and/or chronic outlet obstruction. 3. Simple bilateral renal cysts as above. Electronically Signed   By: Rise Mu M.D.   On: 08/30/2019 14:26     Assessment & Plan    1.  Complete heart block The patient has recurrent complete heart block despite BB washout. He will need permanent pacing today.  He is currently requiring intermittent trancutaneous pacing - I have asked to turn rate down to 50 to try to limit. Dopamine had to be  discontinued 2/2 hypertension and tachycardia.   2.  HTN Stable No change required today  3.  AKI Improving  Dr Ladona Ridgel to see later this morning     For questions or updates, please contact [facility] Please consult [email or web address] for contact info under Cardiology/STEMI.  Signed, Gypsy Balsam, NP  08/31/2019, 7:34 AM   EP Attending  Patient seen and examined. A difficult situation. He has stokes adams attacks with transient CHB and long pauses despite washout of his beta blocker. He has been sedated. He will need to undergo emergent PPM. Unfortunately his temp wire was removed. We have not been able to reach family members. Because of the requirement of transcutaneous pacing, he has been sedated with 2 of IV ativan and morphine. We will plan to proceed with PPM insertion.   Leonia Reeves.D.

## 2019-08-31 NOTE — Progress Notes (Signed)
Chest Xray completed transporting to unit

## 2019-09-01 ENCOUNTER — Inpatient Hospital Stay (HOSPITAL_COMMUNITY): Payer: Medicare HMO

## 2019-09-01 ENCOUNTER — Encounter (HOSPITAL_COMMUNITY): Payer: Self-pay | Admitting: Internal Medicine

## 2019-09-01 LAB — CBC
HCT: 36.1 % — ABNORMAL LOW (ref 39.0–52.0)
Hemoglobin: 11.7 g/dL — ABNORMAL LOW (ref 13.0–17.0)
MCH: 30.6 pg (ref 26.0–34.0)
MCHC: 32.4 g/dL (ref 30.0–36.0)
MCV: 94.5 fL (ref 80.0–100.0)
Platelets: 197 10*3/uL (ref 150–400)
RBC: 3.82 MIL/uL — ABNORMAL LOW (ref 4.22–5.81)
RDW: 14.4 % (ref 11.5–15.5)
WBC: 9.3 10*3/uL (ref 4.0–10.5)
nRBC: 0 % (ref 0.0–0.2)

## 2019-09-01 LAB — COMPREHENSIVE METABOLIC PANEL
ALT: 27 U/L (ref 0–44)
AST: 21 U/L (ref 15–41)
Albumin: 2.8 g/dL — ABNORMAL LOW (ref 3.5–5.0)
Alkaline Phosphatase: 58 U/L (ref 38–126)
Anion gap: 13 (ref 5–15)
BUN: 60 mg/dL — ABNORMAL HIGH (ref 8–23)
CO2: 20 mmol/L — ABNORMAL LOW (ref 22–32)
Calcium: 8.5 mg/dL — ABNORMAL LOW (ref 8.9–10.3)
Chloride: 115 mmol/L — ABNORMAL HIGH (ref 98–111)
Creatinine, Ser: 2.14 mg/dL — ABNORMAL HIGH (ref 0.61–1.24)
GFR calc Af Amer: 33 mL/min — ABNORMAL LOW (ref >60)
GFR calc non Af Amer: 28 mL/min — ABNORMAL LOW (ref >60)
Glucose, Bld: 104 mg/dL — ABNORMAL HIGH (ref 70–99)
Potassium: 4 mmol/L (ref 3.5–5.1)
Sodium: 148 mmol/L — ABNORMAL HIGH (ref 135–145)
Total Bilirubin: 0.5 mg/dL (ref 0.3–1.2)
Total Protein: 7.3 g/dL (ref 6.5–8.1)

## 2019-09-01 MED ORDER — DEXTROSE-NACL 5-0.45 % IV SOLN
INTRAVENOUS | Status: DC
  Administered 2019-09-01: 19:00:00 via INTRAVENOUS

## 2019-09-01 MED ORDER — LABETALOL HCL 5 MG/ML IV SOLN
20.0000 mg | INTRAVENOUS | Status: DC | PRN
  Administered 2019-09-01 – 2019-09-03 (×3): 20 mg via INTRAVENOUS
  Filled 2019-09-01 (×3): qty 4

## 2019-09-01 MED FILL — Heparin Sod (Porcine)-NaCl IV Soln 1000 Unit/500ML-0.9%: INTRAVENOUS | Qty: 500 | Status: AC

## 2019-09-01 NOTE — Progress Notes (Signed)
PROGRESS NOTE    Kenneth Matthews  ZOX:[id number] DOB: 1940/12/25 DOA: 08/28/2019 PCP: Toma Deiters, MD    Brief Narrative:  Kenneth Matthews a 78 y.o.malewith medical history significant ofhypertension on amlodipine and metoprololpresented with SOB.EMS found him in complete AV block.Had elevated BNP and TP. While as inpatient he had several episodes of seizure-like activity and neurology was consulted.Pt underwent s/p PPM now. Going for swallowing evalve tthis am    Consultants:  Cardiology,neurology  Procedures: Temporary pacemaker -08/29/19  Echo : 08/29/19 1. Left ventricular ejection fraction, by visual estimation, is 65 to 70%. The left ventricle has normal function. There is severely increased left ventricular hypertrophy. 2. Left ventricular diastolic parameters are indeterminate. 3. Global right ventricle was not well visualized.The right ventricular size is not well visualized. Right vetricular wall thickness was not assessed. 4. Left atrial size was mildly dilated. 5. Right atrial size was normal. 6. Moderate thickening of the mitral valve leaflet(s). 7. Moderate to severe mitral annular calcification. 8. The mitral valve is abnormal. Mild mitral valve regurgitation. Moderate mitral stenosis. 9. MV peak gradient, 16.8 mmHg. 10. The tricuspid valve is grossly normal. Tricuspid valve regurgitation is trivial. 11. Aortic valve area, by VTI measures 1.90 cm. 12. Aortic valve mean gradient measures 12.0 mmHg. 13. Aortic valve peak gradient measures 21.5 mmHg. 14. The aortic valve is tricuspid. Aortic valve regurgitation is trivial. Mild aortic valve stenosis. 15. The pulmonic valve was grossly normal. Pulmonic valve regurgitation is not visualized. 16. The inferior vena cava is normal in size with greater than 50% respiratory variability, suggesting right atrial pressure of 3 mmHg.  Renal US: 11/15 1. Increased echogenicity within the renal  parenchyma, compatible with medical renal disease. No hydronephrosis. 2. Enlarged prostate. Mild circumferential bladder wall thickening likely related to incomplete distension and/or chronic outlet obstruction. 3. Simple bilateral renal cysts as above.   Antimicrobials:  ceftrixone  Subjective: Pt seen and exmained.  Denies ,shortness of breath ,chest pain dizziness or any other symptoms.  Objective: Vitals:   09/01/19 0400 09/01/19 0500 09/01/19 0600 09/01/19 0700  BP: (!) 163/72 (!) 141/112 (!) 156/76 138/89  Pulse: 89 100 97 94  Resp: 20 16 (!) 23 (!) 23  Temp: 98 F (36.7 C)     TempSrc: Oral     SpO2: 99% 98% 92% 93%  Weight:      Height:        Intake/Output Summary (Last 24 hours) at 09/01/2019 0729 Last data filed at 09/01/2019 0600 Gross per 24 hour  Intake 125.05 ml  Output 1625 ml  Net -1499.95 ml   Filed Weights   08/29/19 0508 08/30/19 0630 08/31/19 0400  Weight: 55.7 kg 55.2 kg 55.7 kg    Examination: General exam:Appears calm and comfortable  Left temporal bruising.  Proving Respiratory system: CTA no w/r/r Cardiovascular system: RegularS1 &S2 heard. No JVD, +2/6SEM, no gallop Left chest: ppm placement , dressing in place Gastrointestinal system:Abdomen is nondistended, soft and nontender. Normal bowel sounds heard.  No guarding or rebound Central nervous system:Awake and alert no focal neurological deficits obvious. Extremities:no edema b/l no cyanosis Skin:warm , dry Psychiatry:Mood &affect appropriate in currently setting    Data Reviewed: I have personally reviewed following labs and imaging studies  CBC: Recent Labs  Lab 08/28/19 2317 08/29/19 0433 08/30/19 0233 08/31/19 0234 09/01/19 0231  WBC 8.6  --  13.9* 9.7 9.3  NEUTROABS 7.8*  --   --   --   --   HGB  12.5* 11.2* 11.3* 11.4* 11.7*  HCT 38.7* 33.0* 33.7* 34.2* 36.1*  MCV 95.1  --  91.8 92.9 94.5  PLT 200  --  190 183 197   Basic Metabolic Panel: Recent  Labs  Lab 08/28/19 2317 08/29/19 0433 08/30/19 0233 08/31/19 0234 09/01/19 0231  NA 135 137 139 142 148*  K 4.8 5.0 4.1 4.1 4.0  CL 104  --  108 110 115*  CO2 19*  --  19* 19* 20*  GLUCOSE 145*  --  109* 89 104*  BUN 45*  --  52* 51* 60*  CREATININE 2.82*  --  2.65* 2.18* 2.14*  CALCIUM 8.9  --  8.2* 8.4* 8.5*  MG 1.9  --   --   --   --    GFR: Estimated Creatinine Clearance: 22.1 mL/min (A) (by C-G formula based on SCr of 2.14 mg/dL (H)). Liver Function Tests: Recent Labs  Lab 08/30/19 0233 08/31/19 0234 09/01/19 0231  AST 37 25 21  ALT 43 37 27  ALKPHOS 64 57 58  BILITOT 0.9 0.8 0.5  PROT 7.2 7.2 7.3  ALBUMIN 2.9* 2.8* 2.8*   No results for input(s): LIPASE, AMYLASE in the last 168 hours. No results for input(s): AMMONIA in the last 168 hours. Coagulation Profile: No results for input(s): INR, PROTIME in the last 168 hours. Cardiac Enzymes: No results for input(s): CKTOTAL, CKMB, CKMBINDEX, TROPONINI in the last 168 hours. BNP (last 3 results) No results for input(s): PROBNP in the last 8760 hours. HbA1C: No results for input(s): HGBA1C in the last 72 hours. CBG: Recent Labs  Lab 08/31/19 0835 08/31/19 1636  GLUCAP 99 111*   Lipid Profile: No results for input(s): CHOL, HDL, LDLCALC, TRIG, CHOLHDL, LDLDIRECT in the last 72 hours. Thyroid Function Tests: No results for input(s): TSH, T4TOTAL, FREET4, T3FREE, THYROIDAB in the last 72 hours. Anemia Panel: No results for input(s): VITAMINB12, FOLATE, FERRITIN, TIBC, IRON, RETICCTPCT in the last 72 hours. Sepsis Labs: No results for input(s): PROCALCITON, LATICACIDVEN in the last 168 hours.  Recent Results (from the past 240 hour(s))  SARS CORONAVIRUS 2 (TAT 6-24 HRS) Nasopharyngeal Nasopharyngeal Swab     Status: None   Collection Time: 08/29/19 12:07 AM   Specimen: Nasopharyngeal Swab  Result Value Ref Range Status   SARS Coronavirus 2 NEGATIVE NEGATIVE Final    Comment: (NOTE) SARS-CoV-2 target nucleic  acids are NOT DETECTED. The SARS-CoV-2 RNA is generally detectable in upper and lower respiratory specimens during the acute phase of infection. Negative results do not preclude SARS-CoV-2 infection, do not rule out co-infections with other pathogens, and should not be used as the sole basis for treatment or other patient management decisions. Negative results must be combined with clinical observations, patient history, and epidemiological information. The expected result is Negative. Fact Sheet for Patients: HairSlick.nohttps://www.fda.gov/media/138098/download Fact Sheet for Healthcare Providers: quierodirigir.comhttps://www.fda.gov/media/138095/download This test is not yet approved or cleared by the [place] FDA and  has been authorized for detection and/or diagnosis of SARS-CoV-2 by FDA under an Emergency Use Authorization (EUA). This EUA will remain  in effect (meaning this test can be used) for the duration of the COVID-19 declaration under Section 56 4(b)(1) of the Act, 21 U.S.C. section 360bbb-3(b)(1), unless the authorization is terminated or revoked sooner. Performed at [facility], [address] [place], [place] [address]   Surgical pcr screen     Status: Abnormal   Collection Time: 08/31/19  7:50 AM   Specimen: Nasal Mucosa; Nasal  Swab  Result Value Ref Range Status   MRSA, PCR NEGATIVE NEGATIVE Final   Staphylococcus aureus POSITIVE (A) NEGATIVE Final    Comment: (NOTE) The Xpert SA Assay (FDA approved for NASAL specimens in patients 61 years of age and older), is one component of a comprehensive surveillance program. It is not intended to diagnose infection nor to guide or monitor treatment. Performed at [facility], [place] [address] [place], [place] [address]          Radiology Studies: US Renal  Result Date: 08/30/2019 CLINICAL DATA:  Initial evaluation for acute renal failure. EXAM: RENAL / URINARY TRACT ULTRASOUND COMPLETE COMPARISON:  None. FINDINGS: Right  Kidney: Renal measurements: 10.0 x 3.7 x 4.5 cm = volume: 85.4 mL. Increased echogenicity within the renal parenchyma, compatible with medical renal disease. No nephrolithiasis or hydronephrosis. 1.5 x 1.1 x 1.2 cm simple cyst present at the lower pole. Additional 0.9 x 0.7 x 0.8 cm exophytic simple cyst present at the interpolar region. Left Kidney: Renal measurements: 9.1 x 4.8 x 5.0 cm = volume: 112.5 mL. Increased echogenicity within the renal parenchyma, compatible with medical renal disease. No nephrolithiasis or hydronephrosis. 3.2 x 2.6 x 3.3 cm simple cyst present at the mid-upper pole. Bladder: Mild bladder wall thickening likely related incomplete distension and/or chronic outlet obstruction. Bilateral ureteral jets are visualized. Other: Enlarged and somewhat nodular prostate with volume of 36.9 cc noted. IMPRESSION: 1. Increased echogenicity within the renal parenchyma, compatible with medical renal disease. No hydronephrosis. 2. Enlarged prostate. Mild circumferential bladder wall thickening likely related to incomplete distension and/or chronic outlet obstruction. 3. Simple bilateral renal cysts as above. Electronically Signed   By: Jeannine Boga M.D.   On: 08/30/2019 14:26   Dg Chest Port 1 View  Result Date: 08/31/2019 CLINICAL DATA:  Status post pacemaker implant EXAM: PORTABLE CHEST 1 VIEW COMPARISON:  08/29/2019 FINDINGS: Bilateral diffuse mild interstitial thickening. Bilateral calcified pleural plaques. Small bilateral pleural effusions. No pneumothorax. Heart and mediastinal contours are unremarkable. Dual lead cardiac pacemaker. There is no acute osseous abnormality. IMPRESSION: Bilateral diffuse mild interstitial thickening and small bilateral pleural effusions concerning for mild pulmonary edema. Electronically Signed   By: Kathreen Devoid   On: 08/31/2019 12:20        Scheduled Meds: . Chlorhexidine Gluconate Cloth  6 each Topical Daily  . mupirocin ointment  1  application Nasal BID  . sodium chloride flush  3 mL Intravenous Q12H   Continuous Infusions: . sodium chloride Stopped (08/30/19 1412)  .  ceFAZolin (ANCEF) IV 1 g (09/01/19 0446)    Assessment & Plan:   Active Problems:   Third degree heart block (HCC)   Acute CHF (congestive heart failure) (HCC)   Essential hypertension   AKI (acute kidney injury) (HCC)   Elevated troponin I level   Acute respiratory failure with hypoxemia (HCC)   Renal insufficiency   Acute diastolic CHF (congestive heart failure) (HCC)   Protein-calorie malnutrition, severe   Complete heart block/AV block/symptomatic bradycardia -Was taking metoprolol tartrate 12.5 mg once a day at home, held here. -was on temporary pacemaker with conduction, when removed, pt had witnessed syncoope...> now s/p PPM  11/16. -Now stable and can be transferred to telemetry.   Acute respiratory failure with hypoxemia- Was started on abx for cap- no evidence on cxr or clinically. Antibiotics was DC'd Likely 2/2 acute DHF Clinically improved now and euvolemic, compensated Monitor closely   Elevated troponin -Secondary to increased demand ischemia in  the setting of prolonged complete heart block. -Echo nml EF - Cards following   Seizure-like activity-seizure vs convulsive syncope -Neurologyinputwas appreciated-recommended Keppra 500 mg twice daily, however DC if seizure-like spells occurre while patient having heart block EEG no seizure activity -On 11/16 per cards  "possible seizure like activity: from pauses and hypoperfusion.  Neurology discontinued Keppra since his spells were witnessed by the cardiology team during bradycardia resulting in transient cerebral hypoperfusion with convulsive syncope.  Acutevs CKD- -Creatinineon admission2.8,improving with lasix prn and ppm in place now.  On admission-had temporary pacer, was given lasix. Now creatinine improving with lasix and s/p PPM Not sure where  his baseline creatinine is , its improving, will need to continue to monitor .  temporary pacer/hyperfusion. Hopefully will continue to improve s/p ppm Ck am labs  Renal US see above results    Aortic stenosis -Mildby echo.  Acute diastolic heart failure -Exacerbated by prolonged bradycardia. - wasinitially on facemask. BNP elevated. -now better, post lasix , and s/p PPM -continue  to monitor volume status  Leukocytosis- Likely reactive/stress.Afebrile and no signs of infection. Continue to monitor -Clinically has improved.  -Essential HTN- meds held on admission until swallowing evaluation completed. Monitor closely, iv beta blk prn    DVT prophylaxis:scd Code Status:full Family Communication:none at bedside Disposition Plan:If medically stable will d/c in 1-2 days. Pt will have barium swallow today      LOS: 3 days   Time spent: 5 minutes with more than 50% on COC    Lynn Ito, MD [facility] Pager [phone]  If 7PM-7AM, please contact night-coverage [email or web address] Password [facility] 09/01/2019, 7:29 AM

## 2019-09-01 NOTE — Progress Notes (Addendum)
Dr. Marlou Porch has confirmed that the spells witnessed by the Cardiology team were most consistent with bradycardia resulting in transient cerebral hypoperfusion with convulsive syncope, in agreement with Dr. Arrie Eastern documentation. Keppra has been discontinued. Neurology will sign off. Please call if there are additional questions.   Electronically signed: Dr. Kerney Elbe

## 2019-09-01 NOTE — Progress Notes (Signed)
Nutrition Follow up  DOCUMENTATION CODES:   Underweight, Severe malnutrition in context of chronic illness  INTERVENTION:   Recommend PEG placement with initiation of enteral nutrition.    -Osmolite 1.5 @ 20 ml/hr -Increase by 10 ml Q4 hours to goal rate of 50 ml/hr (1200 ml) -30 ml Prostat once daily   At goal TF provides: 1800 kcals, 90 grams protein, 914 ml free water.    Monitor magnesium, potassium, and phosphorus daily for at least 3 days, MD to replete as needed, as pt is at risk for refeeding syndrome.   NUTRITION DIAGNOSIS:   Severe Malnutrition related to dysphagia, chronic illness as evidenced by energy intake < or equal to 75% for > or equal to 1 month, severe muscle depletion, severe fat depletion.  Ongoing  GOAL:   Patient will meet greater than or equal to 90% of their needs   Not meeting   MONITOR:   PO intake, Supplement acceptance, Diet advancement, Labs, Weight trends  REASON FOR ASSESSMENT:   Other (Comment)(Low BMI)   ASSESSMENT:   78 year old pt admitted with shortness of breath and weakness with PMH of HTN. Pt found to have 3rd degree heart block, pacemaker placement 08/31/19.  11/16- s/p pacemaker placement   Pt failed MBS this afternoon. Had discussion with SLP regarding swallowing function. Seems as if complication stems from neurological etiology but difficulty to fully determine. Per internal medicine, pt almost medically clear for discharge so Cortrak likely not an option. Discussed that pt could likely work on swallowing once d/c and get proper nutrition from PEG at the same time. Explained to family how PEG and home tube feeding would work. They would like to think about it.    Admission weight: 55.7 kg  Current weight: 55.7 kg   I/O: -4,129 ml since admit UOP: 1,625 ml x 24 hrs    Drips: abx Labs: Na 148 (H) Cr 2.14- trending down   NUTRITION - FOCUSED PHYSICAL EXAM:    Most Recent Value  Orbital Region  Severe depletion   Upper Arm Region  Severe depletion  Thoracic and Lumbar Region  Severe depletion  Buccal Region  Severe depletion  Temple Region  Severe depletion  Clavicle Bone Region  Severe depletion  Clavicle and Acromion Bone Region  Severe depletion  Scapular Bone Region  Severe depletion  Dorsal Hand  Moderate depletion  Patellar Region  Severe depletion  Anterior Thigh Region  Severe depletion  Posterior Calf Region  Severe depletion  Edema (RD Assessment)  None  Hair  Reviewed  Eyes  Reviewed  Mouth  Reviewed  Skin  Reviewed  Nails  Reviewed       Diet Order:   Diet Order            Diet NPO time specified  Diet effective now              EDUCATION NEEDS:   Not appropriate for education at this time  Skin:  Skin Assessment: Skin Integrity Issues: Skin Integrity Issues:: Incisions Incisions: L chest  Last BM:  11/13  Height:   Ht Readings from Last 1 Encounters:  08/29/19 5\' 11"  (1.803 m)    Weight:   Wt Readings from Last 1 Encounters:  08/31/19 55.7 kg    Ideal Body Weight:  78.2 kg  BMI:  Body mass index is 17.13 kg/m.  Estimated Nutritional Needs:   Kcal:  1700-1900  Protein:  85-95  Fluid:  1.7-1.9L  Mariana Single RD,  LDN Clinical Nutrition Pager # - [phone]

## 2019-09-01 NOTE — Progress Notes (Signed)
Progress Note  Patient Name: Kenneth Matthews Date of Encounter: 09/01/2019  Primary Cardiologist: Candee Furbish, MD, Cristopher [place] implanted pacemaker  Subjective   Sitting up in chair, conversant.  No chest pain  Inpatient Medications    Scheduled Meds: . Chlorhexidine Gluconate Cloth  6 each Topical Daily  . mupirocin ointment  1 application Nasal BID  . sodium chloride flush  3 mL Intravenous Q12H   Continuous Infusions: . sodium chloride Stopped (08/30/19 1412)  .  ceFAZolin (ANCEF) IV 1 g (09/01/19 0446)   PRN Meds: sodium chloride, acetaminophen, Melatonin, morphine injection, ondansetron (ZOFRAN) IV, sodium chloride flush   Vital Signs    Vitals:   09/01/19 0400 09/01/19 0500 09/01/19 0600 09/01/19 0700  BP: (!) 163/72 (!) 141/112 (!) 156/76 138/89  Pulse: 89 100 97 94  Resp: 20 16 (!) 23 (!) 23  Temp: 98 F (36.7 C)     TempSrc: Oral     SpO2: 99% 98% 92% 93%  Weight:      Height:        Intake/Output Summary (Last 24 hours) at 09/01/2019 0844 Last data filed at 09/01/2019 0600 Gross per 24 hour  Intake 125.05 ml  Output 1625 ml  Net -1499.95 ml   Last 3 Weights 08/31/2019 08/30/2019 08/29/2019  Weight (lbs) 122 lb 12.7 oz 121 lb 11.1 oz 122 lb 12.7 oz  Weight (kg) 55.7 kg 55.2 kg 55.7 kg      Telemetry    Pacemaker capturing- Personally Reviewed  ECG    Ventricular pacing- Personally Reviewed  Physical Exam   GEN:  Thin Neck: No JVD Cardiac: RRR, no murmurs, rubs, or gallops.  Pacemaker site clean Respiratory: Clear to auscultation bilaterally. GI: Soft, nontender, non-distended  MS: No edema; No deformity. Neuro:  Nonfocal  Psych: Normal affect   Labs    High Sensitivity Troponin:   Recent Labs  Lab 08/28/19 2317 08/29/19 0200 08/29/19 0529 08/29/19 0711  TROPONINIHS 869* 778* 1,306* 1,583*      Chemistry Recent Labs  Lab 08/30/19 0233 08/31/19 0234 09/01/19 0231  NA 139 142 148*  K 4.1 4.1 4.0  CL 108 110 115*   CO2 19* 19* 20*  GLUCOSE 109* 89 104*  BUN 52* 51* 60*  CREATININE 2.65* 2.18* 2.14*  CALCIUM 8.2* 8.4* 8.5*  PROT 7.2 7.2 7.3  ALBUMIN 2.9* 2.8* 2.8*  AST 37 25 21  ALT 43 37 27  ALKPHOS 64 57 58  BILITOT 0.9 0.8 0.5  GFRNONAA 22* 28* 28*  GFRAA 25* 32* 33*  ANIONGAP 12 13 13      Hematology Recent Labs  Lab 08/30/19 0233 08/31/19 0234 09/01/19 0231  WBC 13.9* 9.7 9.3  RBC 3.67* 3.68* 3.82*  HGB 11.3* 11.4* 11.7*  HCT 33.7* 34.2* 36.1*  MCV 91.8 92.9 94.5  MCH 30.8 31.0 30.6  MCHC 33.5 33.3 32.4  RDW 14.1 14.3 14.4  PLT 190 183 197    BNP Recent Labs  Lab 08/28/19 2317 08/30/19 0233  BNP 1,035.9* 1,100.6*     DDimer No results for input(s): DDIMER in the last 168 hours.   Radiology    Dg Chest 2 View  Result Date: 09/01/2019 CLINICAL DATA:  Pacemaker EXAM: CHEST - 2 VIEW COMPARISON:  Portable exam 0612 hours compared to 08/31/2019 FINDINGS: LEFT subclavian transvenous pacemaker with leads projecting at RIGHT atrium and RIGHT ventricle. Normal heart size, mediastinal contours, and pulmonary vascularity. Atherosclerotic calcification aorta. Emphysematous changes with BILATERAL pulmonary infiltrates in the  mid to lower lungs greater on LEFT, slightly improved since previous exam. Small bibasilar pleural effusions greater on RIGHT. Calcified pleural plaques again seen suggesting asbestos exposure. No pneumothorax. Bones demineralized. 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 Slightly improved pulmonary infiltrates with small bibasilar pleural effusions RIGHT greater than LEFT. Electronically Signed   By: Ulyses Southward M.D.   On: 09/01/2019 08:01   US Renal  Result Date: 08/30/2019 CLINICAL DATA:  Initial evaluation for acute renal failure. EXAM: RENAL / URINARY TRACT ULTRASOUND COMPLETE COMPARISON:  None. FINDINGS: Right Kidney: Renal measurements: 10.0 x 3.7 x 4.5 cm = volume: 85.4 mL. Increased echogenicity within the renal parenchyma, compatible  with medical renal disease. No nephrolithiasis or hydronephrosis. 1.5 x 1.1 x 1.2 cm simple cyst present at the lower pole. Additional 0.9 x 0.7 x 0.8 cm exophytic simple cyst present at the interpolar region. Left Kidney: Renal measurements: 9.1 x 4.8 x 5.0 cm = volume: 112.5 mL. Increased echogenicity within the renal parenchyma, compatible with medical renal disease. No nephrolithiasis or hydronephrosis. 3.2 x 2.6 x 3.3 cm simple cyst present at the mid-upper pole. Bladder: Mild bladder wall thickening likely related incomplete distension and/or chronic outlet obstruction. Bilateral ureteral jets are visualized. Other: Enlarged and somewhat nodular prostate with volume of 36.9 cc noted. IMPRESSION: 1. Increased echogenicity within the renal parenchyma, compatible with medical renal disease. No hydronephrosis. 2. Enlarged prostate. Mild circumferential bladder wall thickening likely related to incomplete distension and/or chronic outlet obstruction. 3. Simple bilateral renal cysts as above. Electronically Signed   By: Rise Mu M.D.   On: 08/30/2019 14:26   Dg Chest Port 1 View  Result Date: 08/31/2019 CLINICAL DATA:  Status post pacemaker implant EXAM: PORTABLE CHEST 1 VIEW COMPARISON:  08/29/2019 FINDINGS: Bilateral diffuse mild interstitial thickening. Bilateral calcified pleural plaques. Small bilateral pleural effusions. No pneumothorax. Heart and mediastinal contours are unremarkable. Dual lead cardiac pacemaker. There is no acute osseous abnormality. IMPRESSION: Bilateral diffuse mild interstitial thickening and small bilateral pleural effusions concerning for mild pulmonary edema. Electronically Signed   By: Elige Ko   On: 08/31/2019 12:20    Cardiac Studies   EF 65 to 70%.  Mild aortic valve stenosis  Patient Profile     78 y.o. male status post pacemaker for complete heart block with acute kidney injury trouble swallowing, protein malnutrition  Assessment & Plan     Complete heart block -Appreciate Dr. Ladona Ridgel and Dr. Elberta Fortis.  Pacemaker implanted.  Chest x-ray normal postoperatively.  They will go ahead and established post hospital appointment. -Okay to transfer to telemetry from our perspective.  Swallowing difficulty with severe protein calorie malnutrition -Swallow study to be done by primary team.  Seizure-like activity -Spoke with neurology, this was witnessed during a 14-second ventricular pause.  Clearly what look like seizure was secondary to hypoperfusion.  We have stopped his Keppra.  Neurology also agrees.  Acute kidney injury -Creatinine should continue to improve with improved perfusion.  Aortic stenosis -Mild, follow clinically.  No further evidence of diastolic heart failure.   [facility] will sign off.   Medication Recommendations:  No new Other recommendations (labs, testing, etc):  none Follow up as an outpatient: He will have a close follow-up with EP pacemaker clinic  For questions or updates, please contact [facility] Please consult [email or web address] for contact info under        Signed, Donato Schultz, MD  09/01/2019, 8:44 AM

## 2019-09-01 NOTE — Progress Notes (Addendum)
Modified Barium Swallow Progress Note  Patient Details  Name: Kenneth Matthews MRN: [id number] Date of Birth: 1941-05-13  Today's Date: 09/01/2019  Modified Barium Swallow completed.  Full report located under Chart Review in the Imaging Section.  Brief recommendations include the following:  Clinical Impression  Pt has a severe oropharyngeal and cervical esophageal dysphagia that appears to have been progressing over time per pt, who says that he has had mild trouble for "years" but with an acute exacerbation over the last few months. He has reduced lingual propulsion with slow transit, oral residuals, and poor cohesion. He has trouble initiating a swallow without a larger bolus, but regardless of bolus size he has aspiration and poor pharyngeal clearance. He has little to no base of tongue retraction, hyolaryngeal excursion, epiglottic deflection, or pharyngeal squeeze. He repeatedly tries to swallow to clear severe, diffuse residuals with barium sitting in his laryngeal vestibule as he does, but such little movement means that only small amounts of barium enter the esophagus at a time. Postural maneuvers did not improve efficiency or safety. Pt still had moderate amounts of residue despite his best efforts to clear it, therefore more solid consistencies were deferred.    Messaged MD about the severity of Mr. Thrush dysphagia with high risk for aspiration and inadequate nutrition/hydration. Also note that his dysphagia appears consistent with a neurologically-based dysphagia. 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 SLP will f/u for dysphagia intervention but pt will likely need alternative means of nutrition.    Swallow Evaluation Recommendations       SLP Diet Recommendations: NPO;Ice chips PRN after oral care(few ice chips at a time and only after  oral care)       Medication Administration: Via alternative means               Oral Care Recommendations: Oral care QID   Other Recommendations: Have oral suction available    Venita Sheffield Onie Kasparek 09/01/2019,12:58 PM   Pollyann Glen, M.A. [facility] Environmental education officer [phone] Office [phone]

## 2019-09-01 NOTE — Progress Notes (Signed)
   BP 170's Will give labetalol 20 IV PRN Has pacer for backup  Once he is able to take PO (swallow study dependent) Try to resume home regiment.   Candee Furbish, MD

## 2019-09-01 NOTE — Progress Notes (Addendum)
Electrophysiology Rounding Note  Patient Name: Kenneth Matthews Date of Encounter: 09/01/2019  Primary Cardiologist: Marlou Porch Electrophysiologist: Lovena Le   Subjective   The patient is doing well today.  At this time, the patient denies chest pain, shortness of breath, or any new concerns.  Inpatient Medications    Scheduled Meds: . Chlorhexidine Gluconate Cloth  6 each Topical Daily  . mupirocin ointment  1 application Nasal BID  . sodium chloride flush  3 mL Intravenous Q12H   Continuous Infusions: . sodium chloride Stopped (08/30/19 1412)  .  ceFAZolin (ANCEF) IV 1 g (09/01/19 0446)   PRN Meds: sodium chloride, acetaminophen, Melatonin, morphine injection, ondansetron (ZOFRAN) IV, sodium chloride flush   Vital Signs    Vitals:   09/01/19 0400 09/01/19 0500 09/01/19 0600 09/01/19 0700  BP: (!) 163/72 (!) 141/112 (!) 156/76 138/89  Pulse: 89 100 97 94  Resp: 20 16 (!) 23 (!) 23  Temp: 98 F (36.7 C)     TempSrc: Oral     SpO2: 99% 98% 92% 93%  Weight:      Height:        Intake/Output Summary (Last 24 hours) at 09/01/2019 0930 Last data filed at 09/01/2019 0600 Gross per 24 hour  Intake 85.8 ml  Output 1625 ml  Net -1539.2 ml   Filed Weights   08/29/19 0508 08/30/19 0630 08/31/19 0400  Weight: 55.7 kg 55.2 kg 55.7 kg    Physical Exam    GEN- The patient is well appearing, alert and oriented x 3 today.   Head- normocephalic, atraumatic Eyes-  Sclera clear, conjunctiva pink Ears- hearing intact Oropharynx- clear Neck- supple Lungs- Clear to ausculation bilaterally, normal work of breathing Heart- Regular rate and rhythm  GI- soft, NT, ND, + BS Extremities- no clubbing, cyanosis, or edema Skin- no rash or lesion Psych- euthymic mood, full affect Neuro- strength and sensation are intact  Labs    CBC Recent Labs    08/31/19 0234 09/01/19 0231  WBC 9.7 9.3  HGB 11.4* 11.7*  HCT 34.2* 36.1*  MCV 92.9 94.5  PLT 183 595   Basic Metabolic  Panel Recent Labs    08/31/19 0234 09/01/19 0231  NA 142 148*  K 4.1 4.0  CL 110 115*  CO2 19* 20*  GLUCOSE 89 104*  BUN 51* 60*  CREATININE 2.18* 2.14*  CALCIUM 8.4* 8.5*   Liver Function Tests Recent Labs    08/31/19 0234 09/01/19 0231  AST 25 21  ALT 37 27  ALKPHOS 57 58  BILITOT 0.8 0.5  PROT 7.2 7.3  ALBUMIN 2.8* 2.8*      Telemetry    SR with V pacing (personally reviewed)  Radiology    Dg Chest 2 View  Result Date: 09/01/2019 CLINICAL DATA:  Pacemaker EXAM: CHEST - 2 VIEW COMPARISON:  Portable exam 0612 hours compared to 08/31/2019 FINDINGS: LEFT subclavian transvenous pacemaker with leads projecting at RIGHT atrium and RIGHT ventricle. Normal heart size, mediastinal contours, and pulmonary vascularity. Atherosclerotic calcification aorta. Emphysematous changes with BILATERAL pulmonary infiltrates in the mid to lower lungs greater on LEFT, slightly improved since previous exam. Small bibasilar pleural effusions greater on RIGHT. Calcified pleural plaques again seen suggesting asbestos exposure. No pneumothorax. Bones demineralized. 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 Slightly improved pulmonary infiltrates with small bibasilar pleural effusions RIGHT greater than LEFT. Electronically Signed   By: Lavonia Dana M.D.   On: 09/01/2019 08:01   US Renal  Result Date: 08/30/2019  CLINICAL DATA:  Initial evaluation for acute renal failure. EXAM: RENAL / URINARY TRACT ULTRASOUND COMPLETE COMPARISON:  None. FINDINGS: Right Kidney: Renal measurements: 10.0 x 3.7 x 4.5 cm = volume: 85.4 mL. Increased echogenicity within the renal parenchyma, compatible with medical renal disease. No nephrolithiasis or hydronephrosis. 1.5 x 1.1 x 1.2 cm simple cyst present at the lower pole. Additional 0.9 x 0.7 x 0.8 cm exophytic simple cyst present at the interpolar region. Left Kidney: Renal measurements: 9.1 x 4.8 x 5.0 cm = volume: 112.5 mL. Increased echogenicity  within the renal parenchyma, compatible with medical renal disease. No nephrolithiasis or hydronephrosis. 3.2 x 2.6 x 3.3 cm simple cyst present at the mid-upper pole. Bladder: Mild bladder wall thickening likely related incomplete distension and/or chronic outlet obstruction. Bilateral ureteral jets are visualized. Other: Enlarged and somewhat nodular prostate with volume of 36.9 cc noted. IMPRESSION: 1. Increased echogenicity within the renal parenchyma, compatible with medical renal disease. No hydronephrosis. 2. Enlarged prostate. Mild circumferential bladder wall thickening likely related to incomplete distension and/or chronic outlet obstruction. 3. Simple bilateral renal cysts as above. Electronically Signed   By: Rise Mu M.D.   On: 08/30/2019 14:26   Dg Chest Port 1 View  Result Date: 08/31/2019 CLINICAL DATA:  Status post pacemaker implant EXAM: PORTABLE CHEST 1 VIEW COMPARISON:  08/29/2019 FINDINGS: Bilateral diffuse mild interstitial thickening. Bilateral calcified pleural plaques. Small bilateral pleural effusions. No pneumothorax. Heart and mediastinal contours are unremarkable. Dual lead cardiac pacemaker. There is no acute osseous abnormality. IMPRESSION: Bilateral diffuse mild interstitial thickening and small bilateral pleural effusions concerning for mild pulmonary edema. Electronically Signed   By: Elige Ko   On: 08/31/2019 12:20     Assessment & Plan    1.  Complete heart block Doing well post pacemaker  CXR with leads in stable position and no ptx Device interrogation reviewed and normal Routine wound care and follow up (arranged and entered in AVS)  Electrophysiology team to see as needed while here. Please call with questions.   For questions or updates, please contact [facility] Please consult [email or web address] for contact info under Cardiology/STEMI.  Signed, Gypsy Balsam, NP  09/01/2019, 9:30 AM   EP attending  Patient seen and examined.  Agree  with the findings as noted above.  The patient is doing well status post insertion of a dual-chamber pacemaker secondary to symptomatic complete heart block.  I would recommend transfer out of the unit at this time.  Pacemaker interrogation under my supervision demonstrates normal dual-chamber function.  Chest x-ray is satisfactory.  Lewayne Bunting, MD

## 2019-09-02 DIAGNOSIS — J9601 Acute respiratory failure with hypoxia: Secondary | ICD-10-CM

## 2019-09-02 DIAGNOSIS — R7989 Other specified abnormal findings of blood chemistry: Secondary | ICD-10-CM

## 2019-09-02 LAB — BASIC METABOLIC PANEL
Anion gap: 11 (ref 5–15)
BUN: 55 mg/dL — ABNORMAL HIGH (ref 8–23)
CO2: 22 mmol/L (ref 22–32)
Calcium: 8.7 mg/dL — ABNORMAL LOW (ref 8.9–10.3)
Chloride: 116 mmol/L — ABNORMAL HIGH (ref 98–111)
Creatinine, Ser: 1.72 mg/dL — ABNORMAL HIGH (ref 0.61–1.24)
GFR calc Af Amer: 43 mL/min — ABNORMAL LOW (ref >60)
GFR calc non Af Amer: 37 mL/min — ABNORMAL LOW (ref >60)
Glucose, Bld: 117 mg/dL — ABNORMAL HIGH (ref 70–99)
Potassium: 3.6 mmol/L (ref 3.5–5.1)
Sodium: 149 mmol/L — ABNORMAL HIGH (ref 135–145)

## 2019-09-02 MED ORDER — HALOPERIDOL LACTATE 5 MG/ML IJ SOLN
0.5000 mg | Freq: Once | INTRAMUSCULAR | Status: DC
  Filled 2019-09-02: qty 1

## 2019-09-02 MED ORDER — HALOPERIDOL LACTATE 5 MG/ML IJ SOLN: 1.0000 mg | Freq: Once | INTRAMUSCULAR | Status: DC

## 2019-09-02 MED ORDER — KCL IN DEXTROSE-NACL 40-5-0.45 MEQ/L-%-% IV SOLN
INTRAVENOUS | Status: DC
  Administered 2019-09-02 – 2019-09-03 (×2): via INTRAVENOUS
  Filled 2019-09-02 (×2): qty 1000

## 2019-09-02 NOTE — Progress Notes (Signed)
patient begun aggressively getting out of the bed, noncompliant and refused everything. Patient pulling all his tele leads same as his PIV.And so IVF disconnected. Patient keeps on saying "home", wont lay back but keep on standing up and sits at the edge of the bed alternately.  Pt becomes belligerent,  wont let go on holding  Nurse's hand. At point that he does not want to be touched. Refused mupirocin as well.  Called Marilyn(significant other), she  Spoke with the patient but still patient is noncompliant. Asked [name] to come over to stay with the patient however, [name] stated she will try to find somebody to drive Her.  Paged the hospitalist, got an order of haldol and non violent restraint.  NT stayed with the patient for 2-3 Hours and eventually patient Ok'd to lay back, had this nurse fix his PIV then connected to IVF, pt tries to sleep. Eventually not needing the haldol. Will monitor.

## 2019-09-02 NOTE — Care Management Important Message (Signed)
Important Message  Patient Details  Name: Kenneth Matthews MRN: [id number] Date of Birth: Jul 07, 1941   Medicare Important Message Given:  Yes     [name] 09/02/2019, 12:56 PM

## 2019-09-02 NOTE — Progress Notes (Signed)
PROGRESS NOTE    Kenneth Matthews  NWG:[id number] DOB: 05/24/1941 DOA: 08/28/2019 PCP: Toma DeitersHasanaj, Xaje A, MD    Brief Narrative:  78 y.o.malewith medical history significant ofhypertension on amlodipine and metoprololpresented with SOB.EMS found him in complete AV block.Had elevated BNP and TP. While as inpatient he had several episodes of seizure-like activity and neurology was consulted.Pt underwent s/p PPM  Assessment & Plan:   Active Problems:   Third degree heart block (HCC)   Acute CHF (congestive heart failure) (HCC)   Essential hypertension   AKI (acute kidney injury) (HCC)   Elevated troponin I level   Acute respiratory failure with hypoxemia (HCC)   Renal insufficiency   Acute diastolic CHF (congestive heart failure) (HCC)   Protein-calorie malnutrition, severe   Complete heart block/AV block/symptomatic bradycardia -Was taking metoprolol tartrate 12.5 mg once a day at home, held here. -was ontemporary pacemaker with conduction, when removed, pt had witnessed syncoope...> now s/p PPM  11/16. -Currently stable  Acute respiratory failure with hypoxemia- -Was started on abx for cap- no evidence on cxr or clinically with antibiotics since DC'd -Likely 2/2 acute DHF -Clinically improved now and euvolemic, compensated  Elevated troponin -Secondary to increased demand ischemia in the setting of prolonged complete heart block. -Echo nml EF - Cardiology following with recommendation to resume home meds when able to take PO, see below  Seizure-like activity-seizure vs convulsive syncope -Neurologyinputwas appreciated-recommended Keppra 500 mg twice daily, however DC if seizure-like spells occurre while patient having heart block EEG no seizure activity -On 11/16 per cards "possible seizure like activity: from pauses and hypoperfusion.  -Neurology discontinued Keppra since his spells were witnessed by the cardiology team during bradycardia resulting in  transient cerebral hypoperfusion with convulsive syncope. -Seems to be stable at this time  Acutevs CKD- -Creatinineon admission2.8,improving with lasix prn and ppm in place now.  On admission-had temporarypacer, was given lasix. -Cr has improved with lasix and s/p PPM -Baseline Cr is unclear -Real US personally reviewed. Findings of prostate enlargement and evidence of chronic bladder outlet obstruction -Will f/u with serial bladder scan, anticipate I/o cath if >300cc on scan -Repeat bmet in AM  Aortic stenosis -[place] echo.  Acute diastolic heart failure -Exacerbated by prolonged bradycardia. - wasinitially on facemask. BNP elevated. -now better, post lasix , and s/p PPM -continue  to monitorvolume status  Leukocytosis- -Likely reactive/stress.Afebrile and no signs of infection. Continue to monitor -Clinically has improved.  -Essential HTN- -meds held on admission until cleared to swallow, see below Monitor closely, iv beta blk prn  Dysphagia -Pt has not passed swallow with concerns of continued aspiration -This  AM, pt attempting to drink water with oral contents dripping out of mouth while trying to converse -Appreciate input by SLP. Recommendation remains NPO -Have placed Palliative Care consult as pt seems adamant about wanting to continue taking PO -Pending goals of care  DVT prophylaxis: SCD's Code Status: Full Family Communication: Pt in room, family not at bedside Disposition Plan: Uncertain at this time  Consultants:   Cardiology  Palliative Care  Procedures:  Temporary pacemaker -08/29/19  Antimicrobials: Anti-infectives (From admission, onward)   Start     Dose/Rate Route Frequency Ordered Stop   08/31/19 1600  ceFAZolin (ANCEF) IVPB 1 g/50 mL premix     1 g 100 mL/hr over 30 Minutes Intravenous Every 12 hours 08/31/19 1232 09/01/19 1802   08/31/19 1445  ceFAZolin (ANCEF) IVPB 1 g/50 mL premix  Status:  Discontinued     1  g 100 mL/hr over 30 Minutes Intravenous Every 6 hours 08/31/19 1230 08/31/19 1232   08/31/19 0815  gentamicin (GARAMYCIN) 80 mg in sodium chloride 0.9 % 500 mL irrigation     80 mg Irrigation To Cath Lab 08/31/19 0758 08/31/19 1116   08/31/19 0815  ceFAZolin (ANCEF) IVPB 2g/100 mL premix     2 g 200 mL/hr over 30 Minutes Intravenous To Cath Lab 08/31/19 0758 08/31/19 1030   08/29/19 0715  cefTRIAXone (ROCEPHIN) 1 g in sodium chloride 0.9 % 100 mL IVPB  Status:  Discontinued     1 g 200 mL/hr over 30 Minutes Intravenous Every 24 hours 08/29/19 0707 08/30/19 1213       Subjective: Very much wanting to go home, water coming out of mouth right after drinking while trying to converse  Objective: Vitals:   09/02/19 0402 09/02/19 0450 09/02/19 0751 09/02/19 1135  BP: (!) 158/71 138/74 (!) 157/75 (!) 188/86  Pulse: 82 81 87 93  Resp:  19 18   Temp:    (!) 97.2 F (36.2 C)  TempSrc:    Oral  SpO2: 96% 96%    Weight:  49.7 kg    Height:        Intake/Output Summary (Last 24 hours) at 09/02/2019 1410 Last data filed at 09/02/2019 1300 Gross per 24 hour  Intake 940 ml  Output 1310 ml  Net -370 ml   Filed Weights   08/30/19 0630 08/31/19 0400 09/02/19 0450  Weight: 55.2 kg 55.7 kg 49.7 kg    Examination:  General exam: Appears calm and comfortable  Respiratory system: Clear to auscultation. Respiratory effort normal. Cardiovascular system: S1 & S2 heard, regular Gastrointestinal system: Abdomen is nondistended, soft and nontender. No organomegaly or masses felt. Normal bowel sounds heard. Central nervous system: Alert and oriented. No focal neurological deficits. Extremities: Symmetric 5 x 5 power. Skin: No rashes, lesions  Psychiatry: Judgement and insight appear normal. Mood & affect appropriate.   Data Reviewed: I have personally reviewed following labs and imaging studies  CBC: Recent Labs  Lab 08/28/19 2317 08/29/19 0433 08/30/19 0233 08/31/19 0234 09/01/19 0231   WBC 8.6  --  13.9* 9.7 9.3  NEUTROABS 7.8*  --   --   --   --   HGB 12.5* 11.2* 11.3* 11.4* 11.7*  HCT 38.7* 33.0* 33.7* 34.2* 36.1*  MCV 95.1  --  91.8 92.9 94.5  PLT 200  --  190 183 595   Basic Metabolic Panel: Recent Labs  Lab 08/28/19 2317 08/29/19 0433 08/30/19 0233 08/31/19 0234 09/01/19 0231 09/02/19 0527  NA 135 137 139 142 148* 149*  K 4.8 5.0 4.1 4.1 4.0 3.6  CL 104  --  108 110 115* 116*  CO2 19*  --  19* 19* 20* 22  GLUCOSE 145*  --  109* 89 104* 117*  BUN 45*  --  52* 51* 60* 55*  CREATININE 2.82*  --  2.65* 2.18* 2.14* 1.72*  CALCIUM 8.9  --  8.2* 8.4* 8.5* 8.7*  MG 1.9  --   --   --   --   --    GFR: Estimated Creatinine Clearance: 24.5 mL/min (A) (by C-G formula based on SCr of 1.72 mg/dL (H)). Liver Function Tests: Recent Labs  Lab 08/30/19 0233 08/31/19 0234 09/01/19 0231  AST 37 25 21  ALT 43 37 27  ALKPHOS 64 57 58  BILITOT 0.9 0.8 0.5  PROT 7.2 7.2 7.3  ALBUMIN 2.9* 2.8* 2.8*  No results for input(s): LIPASE, AMYLASE in the last 168 hours. No results for input(s): AMMONIA in the last 168 hours. Coagulation Profile: No results for input(s): INR, PROTIME in the last 168 hours. Cardiac Enzymes: No results for input(s): CKTOTAL, CKMB, CKMBINDEX, TROPONINI in the last 168 hours. BNP (last 3 results) No results for input(s): PROBNP in the last 8760 hours. HbA1C: No results for input(s): HGBA1C in the last 72 hours. CBG: Recent Labs  Lab 08/31/19 0835 08/31/19 1636  GLUCAP 99 111*   Lipid Profile: No results for input(s): CHOL, HDL, LDLCALC, TRIG, CHOLHDL, LDLDIRECT in the last 72 hours. Thyroid Function Tests: No results for input(s): TSH, T4TOTAL, FREET4, T3FREE, THYROIDAB in the last 72 hours. Anemia Panel: No results for input(s): VITAMINB12, FOLATE, FERRITIN, TIBC, IRON, RETICCTPCT in the last 72 hours. Sepsis Labs: No results for input(s): PROCALCITON, LATICACIDVEN in the last 168 hours.  Recent Results (from the past 240  hour(s))  SARS CORONAVIRUS 2 (TAT 6-24 HRS) Nasopharyngeal Nasopharyngeal Swab     Status: None   Collection Time: 08/29/19 12:07 AM   Specimen: Nasopharyngeal Swab  Result Value Ref Range Status   SARS Coronavirus 2 NEGATIVE NEGATIVE Final    Comment: (NOTE) SARS-CoV-2 target nucleic acids are NOT DETECTED. The SARS-CoV-2 RNA is generally detectable in upper and lower respiratory specimens during the acute phase of infection. Negative results do not preclude SARS-CoV-2 infection, do not rule out co-infections with other pathogens, and should not be used as the sole basis for treatment or other patient management decisions. Negative results must be combined with clinical observations, patient history, and epidemiological information. The expected result is Negative. Fact Sheet for Patients: HairSlick.no Fact Sheet for Healthcare Providers: quierodirigir.com This test is not yet approved or cleared by the [place] FDA and  has been authorized for detection and/or diagnosis of SARS-CoV-2 by FDA under an Emergency Use Authorization (EUA). This EUA will remain  in effect (meaning this test can be used) for the duration of the COVID-19 declaration under Section 56 4(b)(1) of the Act, 21 U.S.C. section 360bbb-3(b)(1), unless the authorization is terminated or revoked sooner. Performed at [facility], [address] [place], [place] [address]   Surgical pcr screen     Status: Abnormal   Collection Time: 08/31/19  7:50 AM   Specimen: Nasal Mucosa; Nasal Swab  Result Value Ref Range Status   MRSA, PCR NEGATIVE NEGATIVE Final   Staphylococcus aureus POSITIVE (A) NEGATIVE Final    Comment: (NOTE) The Xpert SA Assay (FDA approved for NASAL specimens in patients 14 years of age and older), is one component of a comprehensive surveillance program. It is not intended to diagnose infection nor to guide or monitor  treatment. Performed at [facility], [address], [place], [place] [address]      Radiology Studies: Dg Chest 2 View  Result Date: 09/01/2019 CLINICAL DATA:  Pacemaker EXAM: CHEST - 2 VIEW COMPARISON:  Portable exam 0612 hours compared to 08/31/2019 FINDINGS: LEFT subclavian transvenous pacemaker with leads projecting at RIGHT atrium and RIGHT ventricle. Normal heart size, mediastinal contours, and pulmonary vascularity. Atherosclerotic calcification aorta. Emphysematous changes with BILATERAL pulmonary infiltrates in the mid to lower lungs greater on LEFT, slightly improved since previous exam. Small bibasilar pleural effusions greater on RIGHT. Calcified pleural plaques again seen suggesting asbestos exposure. No pneumothorax. Bones demineralized. 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 Slightly improved pulmonary infiltrates with small bibasilar pleural effusions RIGHT greater than LEFT.  Electronically Signed   By: Ulyses Southward M.D.   On: 09/01/2019 08:01   Dg Swallowing Func-speech Pathology  Result Date: 09/01/2019 Objective Swallowing Evaluation: Type of Study: MBS-Modified Barium Swallow Study  Patient Details Name: Kenneth Matthews MRN: [id number] Date of Birth: 16-Aug-1941 Today's Date: 09/01/2019 Time: SLP Start Time (ACUTE ONLY): 1025 -SLP Stop Time (ACUTE ONLY): 1045 SLP Time Calculation (min) (ACUTE ONLY): 20 min Past Medical History: Past Medical History: Diagnosis Date . Hypertension  Past Surgical History: Past Surgical History: Procedure Laterality Date . PACEMAKER IMPLANT N/A 08/31/2019  Procedure: PACEMAKER IMPLANT;  Surgeon: Marinus Maw, MD;  Location: [facility] INVASIVE CV LAB;  Service: Cardiovascular;  Laterality: N/A; . TEMPORARY PACEMAKER N/A 08/29/2019  Procedure: TEMPORARY PACEMAKER;  Surgeon: Kathleene Hazel, MD;  Location: [facility] INVASIVE CV LAB;  Service: Cardiovascular;  Laterality: N/A; HPI: Kenneth Matthews is a 78 y.o. male with medical  history significant of hypertension on amlodipine and metoprolol presented with SOB.  EMS found him in complete AV block.Had elevated BNP and TP.  Subjective: pt says his swallowing difficulties worsened over the last few monts Assessment / Plan / Recommendation CHL IP CLINICAL IMPRESSIONS 09/01/2019 Clinical Impression Pt has a severe oropharyngeal and cervical esophageal dysphagia that appears to have been progressing over time per pt, who says that he has had mild trouble for "years" but with an acute exacerbation over the last few months. He has reduced lingual propulsion with slow transit, oral residuals, and poor cohesion. He has trouble initiating a swallow without a larger bolus, but regardless of bolus size he has aspiration and poor pharyngeal clearance. He has little to no base of tongue retraction, hyolaryngeal excursion, epiglottic deflection, or pharyngeal squeeze. He repeatedly tries to swallow to clear severe, diffuse residuals with barium sitting in his laryngeal vestibule as he does, but such little movement means that only small amounts of barium enter the esophagus at a time. Postural maneuvers did not improve efficiency or safety. Pt still had moderate amounts of residue despite his best efforts to clear it, therefore more solid consistencies were deferred. Messaged MD about the severity of Kenneth Matthews dysphagia with high risk for aspiration and inadequate nutrition/hydration. Also note that his dysphagia appears consistent with a neurologically-based dysphagia. 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 SLP will f/u for dysphagia intervention but pt will likely need alternative means of nutrition.  SLP Visit Diagnosis Dysphagia, pharyngoesophageal phase (R13.14);Dysphagia, oropharyngeal phase (R13.12) Attention and concentration deficit following --  Frontal lobe and executive function deficit following -- Impact on safety and function Severe aspiration risk;Risk for inadequate nutrition/hydration   CHL IP TREATMENT RECOMMENDATION 09/01/2019 Treatment Recommendations Therapy as outlined in treatment plan below   Prognosis 09/01/2019 Prognosis for Safe Diet Advancement Fair Barriers to Reach Goals Severity of deficits;Time post onset Barriers/Prognosis Comment -- CHL IP DIET RECOMMENDATION 09/01/2019 SLP Diet Recommendations NPO;Ice chips PRN after oral care Liquid Administration via -- Medication Administration Via alternative means Compensations -- Postural Changes --   CHL IP OTHER RECOMMENDATIONS 09/01/2019 Recommended Consults -- Oral Care Recommendations Oral care QID Other Recommendations Have oral suction available   CHL IP FOLLOW UP RECOMMENDATIONS 09/01/2019 Follow up Recommendations (No Data)   CHL IP FREQUENCY AND DURATION 09/01/2019 Speech Therapy Frequency (ACUTE ONLY) min 2x/week Treatment Duration 2 weeks      CHL IP ORAL PHASE 09/01/2019  Oral Phase Impaired Oral - Pudding Teaspoon -- Oral - Pudding Cup -- Oral - Honey Teaspoon -- Oral - Honey Cup -- Oral - Nectar Teaspoon Reduced posterior propulsion;Lingual/palatal residue;Delayed oral transit;Decreased bolus cohesion Oral - Nectar Cup -- Oral - Nectar Straw -- Oral - Thin Teaspoon -- Oral - Thin Cup Reduced posterior propulsion;Lingual/palatal residue;Delayed oral transit;Decreased bolus cohesion Oral - Thin Straw -- Oral - Puree -- Oral - Mech Soft -- Oral - Regular -- Oral - Multi-Consistency -- Oral - Pill -- Oral Phase - Comment --  CHL IP PHARYNGEAL PHASE 09/01/2019 Pharyngeal Phase Impaired Pharyngeal- Pudding Teaspoon -- Pharyngeal -- Pharyngeal- Pudding Cup -- Pharyngeal -- Pharyngeal- Honey Teaspoon -- Pharyngeal -- Pharyngeal- Honey Cup -- Pharyngeal -- Pharyngeal- Nectar Teaspoon Reduced pharyngeal peristalsis;Reduced epiglottic inversion;Reduced anterior laryngeal mobility;Reduced  airway/laryngeal closure;Reduced tongue base retraction;Penetration/Aspiration before swallow;Penetration/Aspiration during swallow;Pharyngeal residue - valleculae;Pharyngeal residue - pyriform Pharyngeal Material enters airway, passes BELOW cords and not ejected out despite cough attempt by patient Pharyngeal- Nectar Cup -- Pharyngeal -- Pharyngeal- Nectar Straw -- Pharyngeal -- Pharyngeal- Thin Teaspoon -- Pharyngeal -- Pharyngeal- Thin Cup Reduced pharyngeal peristalsis;Reduced epiglottic inversion;Reduced anterior laryngeal mobility;Reduced airway/laryngeal closure;Reduced tongue base retraction;Penetration/Aspiration before swallow;Penetration/Aspiration during swallow;Pharyngeal residue - valleculae;Pharyngeal residue - pyriform Pharyngeal Material enters airway, passes BELOW cords and not ejected out despite cough attempt by patient Pharyngeal- Thin Straw -- Pharyngeal -- Pharyngeal- Puree -- Pharyngeal -- Pharyngeal- Mechanical Soft -- Pharyngeal -- Pharyngeal- Regular -- Pharyngeal -- Pharyngeal- Multi-consistency -- Pharyngeal -- Pharyngeal- Pill -- Pharyngeal -- Pharyngeal Comment --  CHL IP CERVICAL ESOPHAGEAL PHASE 09/01/2019 Cervical Esophageal Phase Impaired Pudding Teaspoon -- Pudding Cup -- Honey Teaspoon -- Honey Cup -- Nectar Teaspoon Reduced cricopharyngeal relaxation Nectar Cup -- Nectar Straw -- Thin Teaspoon -- Thin Cup Reduced cricopharyngeal relaxation Thin Straw -- Puree -- Mechanical Soft -- Regular -- Multi-consistency -- Pill -- Cervical Esophageal Comment -- Virl Axe Nix 09/01/2019, 1:00 PM               Scheduled Meds: . Chlorhexidine Gluconate Cloth  6 each Topical Daily  . mupirocin ointment  1 application Nasal BID  . sodium chloride flush  3 mL Intravenous Q12H   Continuous Infusions: . sodium chloride Stopped (08/30/19 1412)  . dextrose 5 % and 0.45 % NaCl with KCl 40 mEq/L 50 mL/hr at 09/02/19 1610     LOS: 4 days   Rickey Barbara, MD [facility] Pager On  Amion  If 7PM-7AM, please contact night-coverage 09/02/2019, 2:10 PM

## 2019-09-02 NOTE — Plan of Care (Signed)
  Problem: Clinical Measurements: Goal: Ability to maintain clinical measurements within normal limits will improve Outcome: Progressing   Problem: Education: Goal: Knowledge of cardiac device and self-care will improve Outcome: Progressing   Problem: Clinical Measurements: Goal: Diagnostic test results will improve Outcome: Progressing

## 2019-09-02 NOTE — Progress Notes (Signed)
Pt refused foot assessment , pt was educated on why we do them and why they are important in their care. Pt denied any pain or pressure in heels

## 2019-09-02 NOTE — Progress Notes (Signed)
Speech Language Pathology Treatment: Dysphagia  Patient Details Name: Kenneth Matthews MRN: [id number] DOB: 08/25/1941 Today's Date: 09/02/2019 Time: 0950-1009 SLP Time Calculation (min) (ACUTE ONLY): 19 min  Assessment / Plan / Recommendation Clinical Impression  Pt was seen this morning for f/u education and training after MBS on previous date. Pt has cups of water at his bedside despite NPO status. He is drinking them with clinical presentation that is consistent with MBS on next date: multiple swallows, intermittent coughing, wet vocal quality. I tried to cue him to clear his throat and then expectorate but he says he will just swallow it.   Pt is adamant this morning that he wants to eat and drink and leave the hospital. MD already aware. He recalls from [facility] that boluses were not entering his esophagus well, but when asked where they go instead, he says his stomach. Education was provided about aspiration and offered to review imaging with him but he tells me that he does not think that it is actually going into his lungs.   His timeline is a little less clear today: he now tells me that his swallowing has gradually progressed over time, and that his dysarthria is new only this hospital stay and attributes it to a dry mouth from being NPO (although pt has continued to drink water despite NPO order). His presentation on MBS and his dysarthria still raise concern for a neurological etiology.   At this point in time pt says that after talking with his wife last night, he knows that he does not want a PEG. Given the length of time he has been having trouble swallowing, the severity, and the uncertain etiology, I don't think pt would see significant improvement during this hospital stay to warrant something like a Cortrak, and the pt is very clear that he does not want to stay in the hospital any longer. I discussed SLP f/u post-discharge, but pt is not sure that he wants to do any therapy for his  swallowing (although he did say he would think about it).   Per MD, palliative care consult is pending, but pt is likely to accept the risk of aspiration per his discussion with him. SLP will defer diet initiation to MD pending this decision. Until then, would offer limited amounts of ice chips after oral care. Education about the importance of oral care was provided to pt, who says that he already is meticulous about his oral hygiene (which may be why he has not had previous PNAs per his report). SLP will continue to follow as long as pt is open to SLP interventions.   HPI HPI: Kenneth Matthews is a 78 y.o. male with medical history significant of hypertension on amlodipine and metoprolol presented with SOB.  EMS found him in complete AV block.Had elevated BNP and TP.       SLP Plan  Continue with current plan of care       Recommendations  Diet recommendations: NPO(unless accepting aspiration risk) Medication Administration: Via alternative means                Oral Care Recommendations: Oral care QID;Oral care prior to ice chip/H20 Follow up Recommendations: Outpatient SLP SLP Visit Diagnosis: Dysphagia, pharyngoesophageal phase (R13.14);Dysphagia, oropharyngeal phase (R13.12) Plan: Continue with current plan of care       [place] Hanalei Glace 09/02/2019, 10:47 AM  Pollyann Glen, M.A.  [place] Acute Environmental education officer [phone] Office [phone]

## 2019-09-03 DIAGNOSIS — E43 Unspecified severe protein-calorie malnutrition: Secondary | ICD-10-CM

## 2019-09-03 DIAGNOSIS — Z7189 Other specified counseling: Secondary | ICD-10-CM

## 2019-09-03 DIAGNOSIS — Z515 Encounter for palliative care: Secondary | ICD-10-CM

## 2019-09-03 LAB — CBC
HCT: 40.3 % (ref 39.0–52.0)
Hemoglobin: 13.1 g/dL (ref 13.0–17.0)
MCH: 30.9 pg (ref 26.0–34.0)
MCHC: 32.5 g/dL (ref 30.0–36.0)
MCV: 95 fL (ref 80.0–100.0)
Platelets: 205 10*3/uL (ref 150–400)
RBC: 4.24 MIL/uL (ref 4.22–5.81)
RDW: 14.6 % (ref 11.5–15.5)
WBC: 7.8 10*3/uL (ref 4.0–10.5)
nRBC: 0 % (ref 0.0–0.2)

## 2019-09-03 LAB — BASIC METABOLIC PANEL
Anion gap: 10 (ref 5–15)
BUN: 45 mg/dL — ABNORMAL HIGH (ref 8–23)
CO2: 22 mmol/L (ref 22–32)
Calcium: 8.6 mg/dL — ABNORMAL LOW (ref 8.9–10.3)
Chloride: 118 mmol/L — ABNORMAL HIGH (ref 98–111)
Creatinine, Ser: 1.6 mg/dL — ABNORMAL HIGH (ref 0.61–1.24)
GFR calc Af Amer: 47 mL/min — ABNORMAL LOW (ref >60)
GFR calc non Af Amer: 40 mL/min — ABNORMAL LOW (ref >60)
Glucose, Bld: 121 mg/dL — ABNORMAL HIGH (ref 70–99)
Potassium: 4 mmol/L (ref 3.5–5.1)
Sodium: 150 mmol/L — ABNORMAL HIGH (ref 135–145)

## 2019-09-03 NOTE — Evaluation (Signed)
Physical Therapy Evaluation Patient Details Name: Kenneth Matthews MRN: [id number] DOB: Sep 12, 1941 Today's Date: 09/03/2019   History of Present Illness  78 y.o. male with medical history significant of hypertension on amlodipine and metoprolol presented with SOB.  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   Clinical Impression  Pt admitted with above. Pt presents with decreased functional mobility secondary to generalized weakness, balance impairments, and decreased endurance. Ambulating 80 feet with walker at a min guard assist level; improved stability with use of walker compared to without. Education provided re: endurance strategies, activity recommendations/progression, use of DME.      Follow Up Recommendations Home health PT;Supervision/Assistance - 24 hour    Equipment Recommendations  Rolling walker with 5" wheels    Recommendations for Other Services       Precautions / Restrictions Precautions Precautions: Fall Restrictions Weight Bearing Restrictions: No      Mobility  Bed Mobility               General bed mobility comments: OOB in chair  Transfers Overall transfer level: Needs assistance Equipment used: None Transfers: Sit to/from Stand Sit to Stand: Min guard            Ambulation/Gait Ambulation/Gait assistance: Min guard;Min assist Gait Distance (Feet): 80 Feet Assistive device: Rolling walker (2 wheeled);None Gait Pattern/deviations: Step-through pattern;Decreased stride length;Narrow base of support Gait velocity: decreased   General Gait Details: Initially requiring minA for stability without AD, reaching for external support. Progressing to min guard assist with use of walker. Cues for wider BOS, walker proximity, rolling walker rather than picking up  Stairs            Wheelchair Mobility    Modified Rankin (Stroke Patients Only)       Balance Overall balance assessment: Needs assistance Sitting-balance support:  Feet supported Sitting balance-Leahy Scale: Good     Standing balance support: No upper extremity supported;During functional activity Standing balance-Leahy Scale: Fair                               Pertinent Vitals/Pain Pain Assessment: No/denies pain    Home Living Family/patient expects to be discharged to:: Private residence Living Arrangements: Spouse/significant other Available Help at Discharge: Family Type of Home: House         Home Equipment: Shower seat      Prior Function Level of Independence: Independent         Comments: Drives, community ambulatory without AD; pt wife helps with IADL's     Hand Dominance        Extremity/Trunk Assessment   Upper Extremity Assessment Upper Extremity Assessment: Generalized weakness    Lower Extremity Assessment Lower Extremity Assessment: Generalized weakness       Communication   Communication: No difficulties  Cognition Arousal/Alertness: Awake/alert Behavior During Therapy: Flat affect Overall Cognitive Status: Impaired/Different from baseline Area of Impairment: Awareness;Safety/judgement                         Safety/Judgement: Decreased awareness of deficits Awareness: Emergent   General Comments: Focused on desire to go home; decreased awareness of safety/deficits      General Comments      Exercises     Assessment/Plan    PT Assessment Patient needs continued PT services  PT Problem List Decreased strength;Decreased activity tolerance;Decreased balance;Decreased mobility;Decreased cognition;Decreased safety awareness;Decreased knowledge of use  of DME       PT Treatment Interventions DME instruction;Gait training;Functional mobility training;Stair training;Therapeutic activities;Therapeutic exercise;Balance training;Patient/family education    PT Goals (Current goals can be found in the Care Plan section)  Acute Rehab PT Goals Patient Stated Goal: "go  home." PT Goal Formulation: With patient Time For Goal Achievement: 09/17/19 Potential to Achieve Goals: Good    Frequency Min 3X/week   Barriers to discharge        Co-evaluation               AM-PAC PT "6 Clicks" Mobility  Outcome Measure Help needed turning from your back to your side while in a flat bed without using bedrails?: None Help needed moving from lying on your back to sitting on the side of a flat bed without using bedrails?: None Help needed moving to and from a bed to a chair (including a wheelchair)?: A Little Help needed standing up from a chair using your arms (e.g., wheelchair or bedside chair)?: A Little Help needed to walk in hospital room?: A Little Help needed climbing 3-5 steps with a railing? : A Lot 6 Click Score: 19    End of Session Equipment Utilized During Treatment: Gait belt Activity Tolerance: Patient tolerated treatment well Patient left: in chair;with family/visitor present Nurse Communication: Mobility status PT Visit Diagnosis: Unsteadiness on feet (R26.81);Other abnormalities of gait and mobility (R26.89);Muscle weakness (generalized) (M62.81);Difficulty in walking, not elsewhere classified (R26.2)    Time: 3474-2595 PT Time Calculation (min) (ACUTE ONLY): 12 min   Charges:   PT Evaluation $PT Eval Moderate Complexity: 1 Mod          Laurina Bustle, Gray, DPT Acute Rehabilitation Services Pager [phone] Office [phone]   Vanetta Mulders 09/03/2019, 4:45 PM

## 2019-09-03 NOTE — Discharge Summary (Signed)
Physician Discharge Summary  Kenneth Matthews:[id number] DOB: [birth year] DOA: 08/28/2019  PCP: Neale Burly, MD  Admit date: 08/28/2019 Discharge date: 09/03/2019  Admitted From: Home Disposition:  Home  Recommendations for Outpatient Follow-up:  1. Follow up with PCP in 1-2 weeks 2. Follow up with Cardiology as scheduled  Home Health:PT, OT, RN, Aide, SW, SLP   Discharge Condition:Stable CODE STATUS:Full Diet recommendation: As tolerated   Brief/Interim Summary: 78 y.o.malewith medical history significant ofhypertension on amlodipine and metoprololpresented with SOB.EMS found him in complete AV block.Had elevated BNP and TP. While as inpatient he had several episodes of seizure-like activity and neurology was consulted.Pt underwent s/p PPM  Discharge Diagnoses:  Active Problems:   Third degree heart block (HCC)   Acute CHF (congestive heart failure) (HCC)   Essential hypertension   AKI (acute kidney injury) (HCC)   Elevated troponin I level   Acute respiratory failure with hypoxemia (HCC)   Renal insufficiency   Acute diastolic CHF (congestive heart failure) (HCC)   Protein-calorie malnutrition, severe   Complete heart block/AV block/symptomatic bradycardia -Was taking metoprolol tartrate 12.5 mg once a day at home, held here. -was ontemporary pacemaker with conduction, when removed, pt had witnessed syncoope...> now s/p PPM11/16. -Now stable. Cardiology has since signed off  Acute respiratory failure with hypoxemia- -Was started on abx for cap- no evidence on cxr or clinically with antibiotics since [place] -Likely 2/2 acute DHF -Clinically improvednow and euvolemic,compensated  Elevated troponin -Secondary to increased demand ischemia in the setting of prolonged complete heart block. -Echo nml EF - Cardiology following with recommendation to resume home meds on d/c  Seizure-like activity-seizure vs convulsive syncope -Neurologyinputwas  appreciated-recommended Keppra 500 mg twice daily, however DC if seizure-like spells occurre while patient having heart block EEG no seizure activity -On11/16 per cards "possible seizure like activity: from pauses and hypoperfusion. -Neurology discontinued Keppra since his spells were witnessed by the cardiology team during bradycardia resulting in transient cerebral hypoperfusion with convulsive syncope. -Seems to be stable at this time  Acutevs CKD- -Creatinineon admission2.8,improving withlasix prn and ppm in place now.  On admission-hadtemporarypacer, was given lasix. -Cr has improved with lasix and s/p PPM -Baseline Cr is unclear -Real [place] personally reviewed. Findings of prostate enlargement and evidence of chronic bladder outlet obstruction -Renal function improved with IVF hydration  Aortic stenosis -[place] echo.  Acute diastolic heart failure -Exacerbated by prolonged bradycardia. - wasinitially on facemask. BNP elevated. -now better, post lasix, and s/p PPM  Leukocytosis- -Likely reactive/stress.Afebrile and no signs of infection. -Clinically has improved.  -Essential HTN- -home meds initially held on admission given dysphagia and pt had been continued on iv beta blk prn -See below. Pt understands risk and concerns of ongoing aspiration and refuses recommendation for NPO with alternative means of meds and nutrition -Will continue home meds on d/c  Severe Dysphagia -Pt has not passed swallow with concerns of continued aspiration, seen on MBS -Appreciate input by SLP. Recommendation remains NPO and alternative means of nutrition -Seen with pt's wife at bedside. Patient is adamant about going home and declines PEG tube and instead wanting to go home and eat normally. Without prompting and in the presence of his wife, patient is able to voice to me our concerns of dysphagia leading to aspiration, resulting in decompensation and even death. -Pt was  seen by Palliative Care and again was able to repeat those concerns, thus demonstrating insight to his issues. Pt is otherwise alert and oriented, and able to make  medical decisions  Discharge Instructions   Allergies as of 09/03/2019   Not on File     Medication List    TAKE these medications   albuterol 108 (90 Base) MCG/ACT inhaler Commonly known as: VENTOLIN HFA Inhale 2 puffs into the lungs every 4 (four) hours as needed for wheezing or shortness of breath.   amLODipine 5 MG tablet Commonly known as: NORVASC Take 5 mg by mouth daily.   atorvastatin 20 MG tablet Commonly known as: LIPITOR Take 20 mg by mouth at bedtime.   metoprolol tartrate 25 MG tablet Commonly known as: LOPRESSOR Take 12.5 mg by mouth every morning.   tamsulosin 0.4 MG Caps capsule Commonly known as: FLOMAX Take 0.4 mg by mouth daily.            Durable Medical Equipment  (From admission, onward)         Start     Ordered   09/03/19 1557  For home use only DME Walker rolling  Once    Question:  Patient needs a walker to treat with the following condition  Answer:  Weakness   09/03/19 1557         Follow-up Information    [facility] [address] [facility] Follow up on 09/17/2019.   Specialty: Cardiology Why: at 10:30AM Contact information: [address], [address] [place] [id number] [phone]       Marinus Mawaylor, Gregg W, MD Follow up on 11/30/2019.   Specialty: Cardiology Why: at 1:45PM  Contact information: [address] [place] [address] [phone]        Toma DeitersHasanaj, Xaje A, MD. Schedule an appointment as soon as possible for a visit in 1 week(s).   Specialty: Internal Medicine Contact information: [address] [place] [id number] [phone]        Jake BatheSkains, Mark C, MD .   Specialty: Cardiology Contact information: [address] [place] [id number] [phone]        [facility] Follow  [facility].   Why: rolling walker       [facility] Follow up.   Why: RN,PT,OT, AIDE,SW,Speech  [phone]         Not on File  Consultations:  Cardiology  Neurology  Procedures/Studies: Dg Chest 2 View  Result Date: 09/01/2019 CLINICAL DATA:  Pacemaker EXAM: CHEST - 2 VIEW COMPARISON:  Portable exam 0612 hours compared to 08/31/2019 FINDINGS: LEFT subclavian transvenous pacemaker with leads projecting at RIGHT atrium and RIGHT ventricle. Normal heart size, mediastinal contours, and pulmonary vascularity. Atherosclerotic calcification aorta. Emphysematous changes with BILATERAL pulmonary infiltrates in the mid to lower lungs greater on LEFT, slightly improved since previous exam. Small bibasilar pleural effusions greater on RIGHT. Calcified pleural plaques again seen suggesting asbestos exposure. No pneumothorax. Bones demineralized. 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 Slightly improved pulmonary infiltrates with small bibasilar pleural effusions RIGHT greater than LEFT. Electronically Signed   By: Ulyses SouthwardMark  Boles M.D.   On: 09/01/2019 08:01   Ct Head Wo Contrast  Result Date: 08/29/2019 CLINICAL DATA:  Initial evaluation for acute seizure like activity. EXAM: CT HEAD WITHOUT CONTRAST TECHNIQUE: Contiguous axial images were obtained from the base of the skull through the vertex without intravenous contrast. COMPARISON:  None available. FINDINGS: Brain: Moderately advanced age-related cerebral atrophy. Patchy and confluent hypodensity within the periventricular deep white matter both cerebral hemispheres most consistent with chronic small vessel ischemic disease, moderate in nature. Few  scattered remote lacunar infarcts noted within the bilateral basal ganglia and cerebellum. No acute intracranial hemorrhage. No acute large vessel territory infarct. No mass lesion, midline shift or mass effect. No hydrocephalus. No extra-axial fluid collection. Vascular: No  hyperdense vessel. Calcified atherosclerosis at the skull base. Skull: Scalp soft tissues and calvarium within normal limits. Sinuses/Orbits: Globes and orbital soft tissues demonstrate no acute finding. Paranasal sinuses and mastoid air cells are clear. Other: None. IMPRESSION: 1. No acute intracranial abnormality. 2. Moderately advanced age-related cerebral atrophy with chronic small vessel ischemic disease. Electronically Signed   By: Rise Mu M.D.   On: 08/29/2019 14:12   US Renal  Result Date: 08/30/2019 CLINICAL DATA:  Initial evaluation for acute renal failure. EXAM: RENAL / URINARY TRACT ULTRASOUND COMPLETE COMPARISON:  None. FINDINGS: Right Kidney: Renal measurements: 10.0 x 3.7 x 4.5 cm = volume: 85.4 mL. Increased echogenicity within the renal parenchyma, compatible with medical renal disease. No nephrolithiasis or hydronephrosis. 1.5 x 1.1 x 1.2 cm simple cyst present at the lower pole. Additional 0.9 x 0.7 x 0.8 cm exophytic simple cyst present at the interpolar region. Left Kidney: Renal measurements: 9.1 x 4.8 x 5.0 cm = volume: 112.5 mL. Increased echogenicity within the renal parenchyma, compatible with medical renal disease. No nephrolithiasis or hydronephrosis. 3.2 x 2.6 x 3.3 cm simple cyst present at the mid-upper pole. Bladder: Mild bladder wall thickening likely related incomplete distension and/or chronic outlet obstruction. Bilateral ureteral jets are visualized. Other: Enlarged and somewhat nodular prostate with volume of 36.9 cc noted. IMPRESSION: 1. Increased echogenicity within the renal parenchyma, compatible with medical renal disease. No hydronephrosis. 2. Enlarged prostate. Mild circumferential bladder wall thickening likely related to incomplete distension and/or chronic outlet obstruction. 3. Simple bilateral renal cysts as above. Electronically Signed   By: Rise Mu M.D.   On: 08/30/2019 14:26   Dg Chest Port 1 View  Result Date:  08/31/2019 CLINICAL DATA:  Status post pacemaker implant EXAM: PORTABLE CHEST 1 VIEW COMPARISON:  08/29/2019 FINDINGS: Bilateral diffuse mild interstitial thickening. Bilateral calcified pleural plaques. Small bilateral pleural effusions. No pneumothorax. Heart and mediastinal contours are unremarkable. Dual lead cardiac pacemaker. There is no acute osseous abnormality. IMPRESSION: Bilateral diffuse mild interstitial thickening and small bilateral pleural effusions concerning for mild pulmonary edema. Electronically Signed   By: Elige Ko   On: 08/31/2019 12:20   Dg Chest Port 1 View  Result Date: 08/29/2019 CLINICAL DATA:  Shortness of breath for 1 week EXAM: PORTABLE CHEST 1 VIEW COMPARISON:  None. FINDINGS: Cardiac shadow is at the upper limits of normal in size. Aortic calcifications are noted. Calcified pleural plaques are noted. Small right pleural effusion is seen. Right basilar atelectatic changes are noted. No bony abnormality is seen. IMPRESSION: Right basilar atelectasis with small effusion. Bilateral calcified pleural plaques. Electronically Signed   By: Alcide Clever M.D.   On: 08/29/2019 00:21   Dg Swallowing Func-speech Pathology  Result Date: 09/01/2019 Objective Swallowing Evaluation: Type of Study: MBS-Modified Barium Swallow Study  Patient Details Name: Kenneth Matthews MRN: [id number] Date of Birth: 1940/12/25 Today's Date: 09/01/2019 Time: SLP Start Time (ACUTE ONLY): 1025 -SLP Stop Time (ACUTE ONLY): 1045 SLP Time Calculation (min) (ACUTE ONLY): 20 min Past Medical History: Past Medical History: Diagnosis Date . Hypertension  Past Surgical History: Past Surgical History: Procedure Laterality Date . PACEMAKER IMPLANT N/A 08/31/2019  Procedure: PACEMAKER IMPLANT;  Surgeon: Marinus Maw, MD;  Location[facility] INVASIVE CV LAB;  Service: Cardiovascular;  Laterality:  N/A; . TEMPORARY PACEMAKER N/A 08/29/2019  Procedure: TEMPORARY PACEMAKER;  Surgeon: Kathleene Hazel, MD;  Location: [facility]  INVASIVE CV LAB;  Service: Cardiovascular;  Laterality: N/A; HPI: CLAIRE BRIDGE is a 78 y.o. male with medical history significant of hypertension on amlodipine and metoprolol presented with SOB.  EMS found him in complete AV block.Had elevated BNP and TP.  Subjective: pt says his swallowing difficulties worsened over the last few monts Assessment / Plan / Recommendation CHL IP CLINICAL IMPRESSIONS 09/01/2019 Clinical Impression Pt has a severe oropharyngeal and cervical esophageal dysphagia that appears to have been progressing over time per pt, who says that he has had mild trouble for "years" but with an acute exacerbation over the last few months. He has reduced lingual propulsion with slow transit, oral residuals, and poor cohesion. He has trouble initiating a swallow without a larger bolus, but regardless of bolus size he has aspiration and poor pharyngeal clearance. He has little to no base of tongue retraction, hyolaryngeal excursion, epiglottic deflection, or pharyngeal squeeze. He repeatedly tries to swallow to clear severe, diffuse residuals with barium sitting in his laryngeal vestibule as he does, but such little movement means that only small amounts of barium enter the esophagus at a time. Postural maneuvers did not improve efficiency or safety. Pt still had moderate amounts of residue despite his best efforts to clear it, therefore more solid consistencies were deferred. Messaged MD about the severity of Mr. Vonbargen dysphagia with high risk for aspiration and inadequate nutrition/hydration. Also note that his dysphagia appears consistent with a neurologically-based dysphagia. 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 SLP will f/u for dysphagia intervention but pt will likely need alternative means of nutrition.  SLP Visit Diagnosis Dysphagia,  pharyngoesophageal phase (R13.14);Dysphagia, oropharyngeal phase (R13.12) Attention and concentration deficit following -- Frontal lobe and executive function deficit following -- Impact on safety and function Severe aspiration risk;Risk for inadequate nutrition/hydration   CHL IP TREATMENT RECOMMENDATION 09/01/2019 Treatment Recommendations Therapy as outlined in treatment plan below   Prognosis 09/01/2019 Prognosis for Safe Diet Advancement Fair Barriers to Reach Goals Severity of deficits;Time post onset Barriers/Prognosis Comment -- CHL IP DIET RECOMMENDATION 09/01/2019 SLP Diet Recommendations NPO;Ice chips PRN after oral care Liquid Administration via -- Medication Administration Via alternative means Compensations -- Postural Changes --   CHL IP OTHER RECOMMENDATIONS 09/01/2019 Recommended Consults -- Oral Care Recommendations Oral care QID Other Recommendations Have oral suction available   CHL IP FOLLOW UP RECOMMENDATIONS 09/01/2019 Follow up Recommendations (No Data)   CHL IP FREQUENCY AND DURATION 09/01/2019 Speech Therapy Frequency (ACUTE ONLY) min 2x/week Treatment Duration 2 weeks      CHL IP ORAL PHASE 09/01/2019 Oral Phase Impaired Oral - Pudding Teaspoon -- Oral - Pudding Cup -- Oral - Honey Teaspoon -- Oral - Honey Cup -- Oral - Nectar Teaspoon Reduced posterior propulsion;Lingual/palatal residue;Delayed oral transit;Decreased bolus cohesion Oral - Nectar Cup -- Oral - Nectar Straw -- Oral - Thin Teaspoon -- Oral - Thin Cup Reduced posterior propulsion;Lingual/palatal residue;Delayed oral transit;Decreased bolus cohesion Oral - Thin Straw -- Oral - Puree -- Oral - Mech Soft -- Oral - Regular -- Oral - Multi-Consistency -- Oral - Pill -- Oral Phase - Comment --  CHL IP PHARYNGEAL PHASE 09/01/2019 Pharyngeal Phase Impaired Pharyngeal- Pudding Teaspoon -- Pharyngeal -- Pharyngeal- Pudding Cup -- Pharyngeal -- Pharyngeal- Honey Teaspoon --  Pharyngeal -- Pharyngeal- Honey Cup -- Pharyngeal --  Pharyngeal- Nectar Teaspoon Reduced pharyngeal peristalsis;Reduced epiglottic inversion;Reduced anterior laryngeal mobility;Reduced airway/laryngeal closure;Reduced tongue base retraction;Penetration/Aspiration before swallow;Penetration/Aspiration during swallow;Pharyngeal residue - valleculae;Pharyngeal residue - pyriform Pharyngeal Material enters airway, passes BELOW cords and not ejected out despite cough attempt by patient Pharyngeal- Nectar Cup -- Pharyngeal -- Pharyngeal- Nectar Straw -- Pharyngeal -- Pharyngeal- Thin Teaspoon -- Pharyngeal -- Pharyngeal- Thin Cup Reduced pharyngeal peristalsis;Reduced epiglottic inversion;Reduced anterior laryngeal mobility;Reduced airway/laryngeal closure;Reduced tongue base retraction;Penetration/Aspiration before swallow;Penetration/Aspiration during swallow;Pharyngeal residue - valleculae;Pharyngeal residue - pyriform Pharyngeal Material enters airway, passes BELOW cords and not ejected out despite cough attempt by patient Pharyngeal- Thin Straw -- Pharyngeal -- Pharyngeal- Puree -- Pharyngeal -- Pharyngeal- Mechanical Soft -- Pharyngeal -- Pharyngeal- Regular -- Pharyngeal -- Pharyngeal- Multi-consistency -- Pharyngeal -- Pharyngeal- Pill -- Pharyngeal -- Pharyngeal Comment --  CHL IP CERVICAL ESOPHAGEAL PHASE 09/01/2019 Cervical Esophageal Phase Impaired Pudding Teaspoon -- Pudding Cup -- Honey Teaspoon -- Honey Cup -- Nectar Teaspoon Reduced cricopharyngeal relaxation Nectar Cup -- Nectar Straw -- Thin Teaspoon -- Thin Cup Reduced cricopharyngeal relaxation Thin Straw -- Puree -- Mechanical Soft -- Regular -- Multi-consistency -- Pill -- Cervical Esophageal Comment -- Kenneth Matthews 09/01/2019, 1:00 PM               Subjective: Very eager to go home  Discharge Exam: Vitals:   09/03/19 1125 09/03/19 1244  BP: (!) 190/83 (!) 173/71  Pulse: 93 76  Resp: 18   Temp: 97.8 F (36.6 C)   SpO2: 98%    Vitals:   09/02/19 1947 09/03/19 0600 09/03/19 1125 09/03/19  1244  BP: (!) 162/78 (!) 161/62 (!) 190/83 (!) 173/71  Pulse: (!) 103 90 93 76  Resp: Temp: 97.8 F (36.6 C) 98.9 F (37.2 C) 97.8 F (36.6 C)   TempSrc: Oral Oral    SpO2: 96% 96% 98%   Weight:  49.6 kg    Height:        General: Pt is alert, awake, not in acute distress Cardiovascular: RRR, S1/S2 +, no rubs, no gallops Respiratory: CTA bilaterally, no wheezing, no rhonchi Abdominal: Soft, NT, ND, bowel sounds + Extremities: no edema, no cyanosis   The results of significant diagnostics from this hospitalization (including imaging, microbiology, ancillary and laboratory) are listed below for reference.     Microbiology: Recent Results (from the past 240 hour(s))  SARS CORONAVIRUS 2 (TAT 6-24 HRS) Nasopharyngeal Nasopharyngeal Swab     Status: None   Collection Time: 08/29/19 12:07 AM   Specimen: Nasopharyngeal Swab  Result Value Ref Range Status   SARS Coronavirus 2 NEGATIVE NEGATIVE Final    Comment: (NOTE) SARS-CoV-2 target nucleic acids are NOT DETECTED. The SARS-CoV-2 RNA is generally detectable in upper and lower respiratory specimens during the acute phase of infection. Negative results do not preclude SARS-CoV-2 infection, do not rule out co-infections with other pathogens, and should not be used as the sole basis for treatment or other patient management decisions. Negative results must be combined with clinical observations, patient history, and epidemiological information. The expected result is Negative. Fact Sheet for Patients: HairSlick.no Fact Sheet for Healthcare Providers: quierodirigir.com This test is not yet approved or cleared by the [place] FDA and  has been authorized for detection and/or diagnosis of SARS-CoV-2 by FDA under an Emergency Use Authorization (EUA). This EUA will remain  in effect (meaning this test can be used) for the duration of the COVID-19 declaration  under  Section 56 4(b)(1) of the Act, 21 U.S.C. section 360bbb-3(b)(1), unless the authorization is terminated or revoked sooner. Performed at [facility], [address] [place], [place] [address]   Surgical pcr screen     Status: Abnormal   Collection Time: 08/31/19  7:50 AM   Specimen: Nasal Mucosa; Nasal Swab  Result Value Ref Range Status   MRSA, PCR NEGATIVE NEGATIVE Final   Staphylococcus aureus POSITIVE (A) NEGATIVE Final    Comment: (NOTE) The Xpert SA Assay (FDA approved for NASAL specimens in patients 12 years of age and older), is one component of a comprehensive surveillance program. It is not intended to diagnose infection nor to guide or monitor treatment. Performed at [facility], [address], [place], [place] [address]      Labs: BNP (last 3 results) Recent Labs    08/28/19 2317 08/30/19 0233  BNP 1,035.9* 1,100.6*   Basic Metabolic Panel: Recent Labs  Lab 08/28/19 2317  08/30/19 0233 08/31/19 0234 09/01/19 0231 09/02/19 0527 09/03/19 0500  NA 135   < > 139 142 148* 149* 150*  K 4.8   < > 4.1 4.1 4.0 3.6 4.0  CL 104  --  108 110 115* 116* 118*  CO2 19*  --  19* 19* 20* 22 22  GLUCOSE 145*  --  109* 89 104* 117* 121*  BUN 45*  --  52* 51* 60* 55* 45*  CREATININE 2.82*  --  2.65* 2.18* 2.14* 1.72* 1.60*  CALCIUM 8.9  --  8.2* 8.4* 8.5* 8.7* 8.6*  MG 1.9  --   --   --   --   --   --    < > = values in this interval not displayed.   Liver Function Tests: Recent Labs  Lab 08/30/19 0233 08/31/19 0234 09/01/19 0231  AST 37 25 21  ALT 43 37 27  ALKPHOS 64 57 58  BILITOT 0.9 0.8 0.5  PROT 7.2 7.2 7.3  ALBUMIN 2.9* 2.8* 2.8*   No results for input(s): LIPASE, AMYLASE in the last 168 hours. No results for input(s): AMMONIA in the last 168 hours. CBC: Recent Labs  Lab 08/28/19 2317 08/29/19 0433 08/30/19 0233 08/31/19 0234 09/01/19 0231 09/03/19 0500  WBC 8.6  --  13.9* 9.7 9.3 7.8  NEUTROABS 7.8*  --   --   --   --   --    HGB 12.5* 11.2* 11.3* 11.4* 11.7* 13.1  HCT 38.7* 33.0* 33.7* 34.2* 36.1* 40.3  MCV 95.1  --  91.8 92.9 94.5 95.0  PLT 200  --  190 183 197 205   Cardiac Enzymes: No results for input(s): CKTOTAL, CKMB, CKMBINDEX, TROPONINI in the last 168 hours. BNP: Invalid input(s): POCBNP CBG: Recent Labs  Lab 08/31/19 0835 08/31/19 1636  GLUCAP 99 111*   D-Dimer No results for input(s): DDIMER in the last 72 hours. Hgb A1c No results for input(s): HGBA1C in the last 72 hours. Lipid Profile No results for input(s): CHOL, HDL, LDLCALC, TRIG, CHOLHDL, LDLDIRECT in the last 72 hours. Thyroid function studies No results for input(s): TSH, T4TOTAL, T3FREE, THYROIDAB in the last 72 hours.  Invalid input(s): FREET3 Anemia work up No results for input(s): VITAMINB12, FOLATE, FERRITIN, TIBC, IRON, RETICCTPCT in the last 72 hours. Urinalysis No results found for: COLORURINE, APPEARANCEUR, LABSPEC, PHURINE, GLUCOSEU, HGBUR, BILIRUBINUR, KETONESUR, PROTEINUR, UROBILINOGEN, NITRITE, LEUKOCYTESUR Sepsis Labs Invalid input(s): PROCALCITONIN,  WBC,  LACTICIDVEN Microbiology Recent Results (from the past 240  hour(s))  SARS CORONAVIRUS 2 (TAT 6-24 HRS) Nasopharyngeal Nasopharyngeal Swab     Status: None   Collection Time: 08/29/19 12:07 AM   Specimen: Nasopharyngeal Swab  Result Value Ref Range Status   SARS Coronavirus 2 NEGATIVE NEGATIVE Final    Comment: (NOTE) SARS-CoV-2 target nucleic acids are NOT DETECTED. The SARS-CoV-2 RNA is generally detectable in upper and lower respiratory specimens during the acute phase of infection. Negative results do not preclude SARS-CoV-2 infection, do not rule out co-infections with other pathogens, and should not be used as the sole basis for treatment or other patient management decisions. Negative results must be combined with clinical observations, patient history, and epidemiological information. The expected result is Negative. Fact Sheet for  Patients: HairSlick.no Fact Sheet for Healthcare Providers: quierodirigir.com This test is not yet approved or cleared by the [place] FDA and  has been authorized for detection and/or diagnosis of SARS-CoV-2 by FDA under an Emergency Use Authorization (EUA). This EUA will remain  in effect (meaning this test can be used) for the duration of the COVID-19 declaration under Section 56 4(b)(1) of the Act, 21 U.S.C. section 360bbb-3(b)(1), unless the authorization is terminated or revoked sooner. Performed at [facility], [address], [place], [place] [address]   Surgical pcr screen     Status: Abnormal   Collection Time: 08/31/19  7:50 AM   Specimen: Nasal Mucosa; Nasal Swab  Result Value Ref Range Status   MRSA, PCR NEGATIVE NEGATIVE Final   Staphylococcus aureus POSITIVE (A) NEGATIVE Final    Comment: (NOTE) The Xpert SA Assay (FDA approved for NASAL specimens in patients 34 years of age and older), is one component of a comprehensive surveillance program. It is not intended to diagnose infection nor to guide or monitor treatment. Performed at [facility], [address], [place], [place] [address]    Time spent: 30 min  SIGNED:   Rickey Barbara, MD  [facility] 09/03/2019, 4:59 PM  If 7PM-7AM, please contact night-coverage

## 2019-09-03 NOTE — TOC Transition Note (Addendum)
Transition of Care [facility]) - CM/SW Discharge Note   Patient Details  Name: Kenneth Matthews MRN: [id number] Date of Birth: 02/29/1941  Transition of [facility]) CM/SW Contact:  Zenon Mayo, RN Phone Number: 09/03/2019, 4:01 PM   Clinical Narrative:    Patient for dc today, NCM offered choice to patient and spouse, they chose [facility], referral made to [place] with [facility], soc will begin on Saturday, she is able to take referral.  Also they wanted a rolling walker, referral made to [facility] with [facility], he will bring walker to patient room. NCM received message back from [place] states she can not take patient .  NCM informed wife and patient, she states to get who ever can take the patient for [facility] services it does not matter which agency it is. NCM made referral to [facility] with [facility]. She was able to take referral. Soc will begin 24 to 48 hrs post dc.    Final next level of care: [place] Barriers to Discharge: No Barriers Identified   Patient Goals and CMS Choice Patient states their goals for this hospitalization and ongoing recovery are:: get better CMS Medicare.gov Compare Post Acute Care list provided to:: Patient Choice offered to / list presented to : Patient, Spouse  Discharge Placement                       Discharge Plan and Services                DME Arranged: Walker rolling DME Agency: [employer] Date DME Agency Contacted: 09/03/19 Time DME Agency Contacted: 46 Representative spoke with at DME Agency: zack HH Arranged: RN, PT, OT, Nurse's Aide, Speech Therapy, Social Work [employer] Agency: [facility] [place] Date [place]: 09/03/19 Time [place]: [place] Representative spoke with at [place]: [place] ([place]) Interventions     Readmission Risk Interventions No flowsheet data found.

## 2019-09-03 NOTE — Consult Note (Signed)
Consultation Note Date: 09/03/2019   Patient Name: Kenneth Matthews  DOB: 1941-10-10  MRN: [id number]  Age / Sex: 78 y.o., male   PCP: Neale Burly, MD Referring Physician: Donne Hazel, MD   REASON FOR CONSULTATION:Establishing goals of care  Palliative Care consult requested for this 78 y.o. male with multiple medical problems including hypertension, hyperlipidemia, NSTEMI (10/13/2012) COPD, tobacco use (0.5 pk/60 years), and BPH. He presented to ED via EMS from home with complaints of shortness of breath. EMS reported patient was found to be in complete AV block on assessment. During his work up BNP elevated at 1100 and Troponin 869. BUN 52, Cr 2.65. Cardiology following patient and permanent pacemaker was placed due to heart rates into the 20s. Patient also with significant dysphagia with failed MBS requiring NPO status with concerns for continued aspiration. Palliative Medicine consulted for goals of care.    Clinical Assessment and Goals of Care: I have reviewed medical records including lab results, imaging, Epic notes, and MAR, received report from the bedside RN, and assessed the patient. I met at the bedside with patient and his significant other Kenneth Matthews (who he introduced as his wife) to discuss diagnosis prognosis, GOC, EOL wishes, disposition and options. Kenneth Matthews, awake, Alert & oriented x4. He was able to engage in goals of care.   I introduced Palliative Medicine as specialized medical care for people living with serious illness. It focuses on providing relief from the symptoms and stress of a serious illness. The goal is to improve quality of life for both the patient and the family. They both verbalized understanding and appreciation of our involvement.   We discussed a brief life review of the patient, along with his functional and nutritional status. Kenneth Matthews reports he served in the [employer] for 4 years after high-school. He is a retired Civil engineer, contracting and  also owned a Copywriter, advertising. He and Kenneth Matthews have been together for 35 years. He has 2 sons from a previous marriage.   Prior to admission [name] reports providing all care independently. He denies use of any assistive devices. He reports the only concerns that he had was dysphagia which he has struggled with for several years. He reports his father also had similar concerns requiring esophageal stretching x3. Kenneth Matthews denies seeking medical evaluation or treatment for his dysphagia but endorses worsening over the past 6-9 months. He reports decreased appetite, coughing and gagging with thicker foods such as meats, breads, and some thin liquids. He reports he usually eats food with sauces or lots of gravy. He also states he drinks coffee to help with the food going down. He states he has loss about 25lbs over the past 9 months.   We discussed His current illness and what it means in the larger context of His on-going co-morbidities. With specific discussions regarding his high risk for aspiration related to his dysphagia, acute on chronic renal failure, and his overall functional and nutritional state. Natural disease trajectory and expectations at EOL were discussed.  Both Kenneth Matthews and Kenneth Matthews verbalized their awareness of his health conditions. We discussed in detail his high risk of aspiration and potential complications of aspiration including reoccurring pneumonia, weakness, and death. Kenneth Matthews verbalized his understanding and was able to appropriate summarize his understanding of what aspiration was and his risk, including death. He states "I am going to eat what I want and that's all that matters. I like to eat and taste my food.  That is my quality of life!"  I discussed with him detail the potential option of artificial feedings knowing the risk of aspiration. KennethMatthews states he would not ever be interesting in artificial feedings or tubes that provided feedings. He states "If I can't eat  or taste it by mouth, then I don't want it and I will take my chances on continuing to eat. I do not want to even consider or discuss no tubes!" Kenneth Matthews verbalized her agreement expressing he has expressed these similar wishes to her on previous occassions.   I attempted to elicit values and goals of care important to the patient.    The difference between aggressive medical intervention and comfort care was considered in light of the patient's goals of care. Kenneth Matthews again verbalized wishes to be able to eat and his awareness of his aspiration risk and possibility of death. He states that is all he is concerned with is "eating what I want and getting home to his own environment".   Advanced directives, concepts specific to code status, and rehospitalization were considered and discussed. Patient states he does not have a documented advanced directive but should obtain one. He expresses that Kenneth Matthews would be his health care decision maker. I educated patient and Kenneth Matthews that a referral can be placed and the spiritual team could assist him in completing this document while hospitalized. He declined.   I discussed in detail his current full code status with consideration to his co-morbidities and expressed wished to continue to eat being aware of risk. He verbalized wishes for DNR/DNI stating "I would never want to be placed on life-support. If I go I am ready just let me go!" Support given. Kenneth Matthews acknowledged his request stating this is the first they have discussed and glad to know his wishes. He asked about documentation to take home expressing his wishes. I completed a DNR form at the bedside and educated patient and wife on document with awareness it would be placed on chart for him to take home and also the order would be placed into the computer. Both verbalized understanding. I also educated patient and spouse on MOST form. We reviewed together and patient was initially in process of completing  with me. He began asking about if he could now be discharged home today. I advised I would discuss with his attending and could follow up but felt it may not be today. Patient became somewhat irritated stating he knows he does not have to stay here if he doesn't want to regardless of what we felt. Wife reports he just wants to get back home and doesn't want to be here another night, also expressing their awareness of our concerns with his dysphagia. He again states "I can live with it and like I said whatever happens happens!" Support given and I again educated patient and spouse I would communicate with the attending regarding his request.   He continued to be agitated stating he has changed his mind and does not want the DNR form, does not want it placed in his chart, and no longer wished to complete MOST form. He expressed he knows he has to complete these documents but he is ready to go and will complete with his attorney once he gets a chance. I encouraged patient if his wishes are for DNR that he did not need to complete MOST, however, if he suffered a medical emergency requiring heroic interventions we would perform CPR and intubation if required. He verbalized understanding  and expressed that would not happen. Wife expressed apologies expressing patient is anxious to get out of the hospital. Patient offered apologies as well and confirming. He again confirmed wishes to discard DNR request.   Questions and concerns were addressed.  Hard Choices booklet left for review. The family was encouraged to call with questions or concerns.  PMT will continue to support holistically.   SOCIAL HISTORY:     reports that he has quit smoking. He has never used smokeless tobacco.  CODE STATUS: Full code  ADVANCE DIRECTIVES: Next of Kin   SYMPTOM MANAGEMENT: Per attending   Palliative Prophylaxis:   Aspiration  PSYCHO-SOCIAL/SPIRITUAL:  Support System:Family   Desire for further Chaplaincy support:NO    Additional Recommendations (Limitations, Scope, Preferences):  Full Scope Treatment   PAST MEDICAL HISTORY: Past Medical History:  Diagnosis Date  . Hypertension     PAST SURGICAL HISTORY:  Past Surgical History:  Procedure Laterality Date  . PACEMAKER IMPLANT N/A 08/31/2019   Procedure: PACEMAKER IMPLANT;  Surgeon: Evans Lance, MD;  Location: [place] [facility];  Service: Cardiovascular;  Laterality: N/A;  . TEMPORARY PACEMAKER N/A 08/29/2019   Procedure: TEMPORARY PACEMAKER;  Surgeon: Burnell Blanks, MD;  Location: [place] [facility];  Service: Cardiovascular;  Laterality: N/A;    ALLERGIES:  has no allergies on file.   MEDICATIONS:  Current Facility-Administered Medications  Medication Dose Route Frequency Provider Last Rate Last Dose  . 0.9 %  sodium chloride infusion  250 mL Intravenous PRN Nolberto Hanlon, MD   Stopped at 08/30/19 1412  . acetaminophen (TYLENOL) tablet 325-650 mg  325-650 mg Oral Q4H PRN Nolberto Hanlon, MD      . Chlorhexidine Gluconate Cloth 2 % PADS 6 each  6 each Topical Daily Nolberto Hanlon, MD   6 each at 09/03/19 0857  . dextrose 5 % and 0.45 % NaCl with KCl 40 mEq/L infusion   Intravenous Continuous Donne Hazel, MD 50 mL/hr at 09/03/19 0631    . haloperidol lactate (HALDOL) injection 1 mg  1 mg Intramuscular Once Kirby-Graham, Karsten Fells, NP      . labetalol (NORMODYNE) injection 20 mg  20 mg Intravenous Q2H PRN Barrett, Rhonda G, PA-C   20 mg at 09/03/19 1144  . Melatonin TABS 9 mg  9 mg Oral QHS PRN Nolberto Hanlon, MD   9 mg at 08/29/19 2228  . morphine 2 MG/ML injection 1-2 mg  1-2 mg Intravenous Q3H PRN Nolberto Hanlon, MD   2 mg at 08/31/19 0701  . mupirocin ointment (BACTROBAN) 2 % 1 application  1 application Nasal BID Nolberto Hanlon, MD   1 application at 54/27/06 0856  . ondansetron (ZOFRAN) injection 4 mg  4 mg Intravenous Q6H PRN Nolberto Hanlon, MD      . sodium chloride flush (NS) 0.9 % injection 3 mL  3 mL Intravenous Q12H Nolberto Hanlon,  MD   3 mL at 09/03/19 0857  . sodium chloride flush (NS) 0.9 % injection 3 mL  3 mL Intravenous PRN Nolberto Hanlon, MD        VITAL SIGNS: BP (!) 173/71   Pulse 76   Temp 97.8 F (36.6 C)   Resp 18   Ht '5\' 11"'$  (1.803 m)   Wt 49.6 kg Comment: scale c  SpO2 98%   BMI 15.24 kg/m  Filed Weights   08/31/19 0400 09/02/19 0450 09/03/19 0600  Weight: 55.7 kg 49.7 kg 49.6 kg    Estimated body mass index  is 15.24 kg/m as calculated from the following:   Height as of this encounter: '5\' 11"'$  (1.803 m).   Weight as of this encounter: 49.6 kg.  LABS: CBC:    Component Value Date/Time   WBC 7.8 09/03/2019 0500   HGB 13.1 09/03/2019 0500   HCT 40.3 09/03/2019 0500   PLT 205 09/03/2019 0500   Comprehensive Metabolic Panel:    Component Value Date/Time   NA 150 (H) 09/03/2019 0500   K 4.0 09/03/2019 0500   CO2 22 09/03/2019 0500   BUN 45 (H) 09/03/2019 0500   CREATININE 1.60 (H) 09/03/2019 0500   ALBUMIN 2.8 (L) 09/01/2019 0231     Review of Systems  Constitutional: Positive for appetite change and unexpected weight change.  HENT: Positive for trouble swallowing.   Respiratory: Positive for choking and shortness of breath.   Neurological: Positive for weakness.   Unless otherwise noted, a complete review of systems is negative.  Physical Exam General: NAD,  thin Cardiovascular: regular rate and rhythm Pulmonary: clear ant fields Abdomen: soft, nontender, + bowel sounds Extremities: no edema, no joint deformities Skin: no rashes Neurological: A&O x3, mood appropriate    Prognosis: Guarded in the setting of acute on chronic renal failure, dysphagia with high risk of aspiration however patient insist on continuing with regular diet, no restrictions with awareness of risk, hypertension, acute diastolic heart failure, third degree heart block, pacemaker placement, malnutrition, weight loss 20lbs over past 6-9 months, deconditioned.   Discharge Planning:  Patient requesting  discharge home  Recommendations:  Full Code-patient originally expressed wishes for DNR/DNI however rescinded by the time I left room.   Patient and spouse requesting to discharge home today and plans to resume regular diet despite awareness of high risk of aspiration and severe risk including death. Patient verbalized his awareness and plans to take risk. Declines PEG/artificial feeding/or diet alterations. Dr. Wyline Copas aware and updated.   Did not discuss palliative or hospice given patient's irritability and request to discharge home.     Palliative Performance Scale: PPS 30%              Patient and spouse, Kenneth Matthews expressed understanding and was in agreement with this plan.   Thank you for allowing the Palliative Medicine Team to assist in the care of this patient.  Time In: 1335 Time Out: 1420 Time Total: 45 min.   Visit consisted of counseling and education dealing with the complex and emotionally intense issues of symptom management and palliative care in the setting of serious and potentially life-threatening illness.Greater than 50%  of this time was spent counseling and coordinating care related to the above assessment and plan.  Signed by:  Alda Lea, AGPCNP-BC Palliative Medicine Team  Phone: [phone] Fax: [phone] Pager: [phone] Amion: Bjorn Pippin

## 2019-09-03 NOTE — Progress Notes (Signed)
  Speech Language Pathology Treatment: Dysphagia  Patient Details Name: Kenneth Matthews MRN: [id number] DOB: [birth year] Today's Date: 09/03/2019 Time: 3546-5681 SLP Time Calculation (min) (ACUTE ONLY): 17 min  Assessment / Plan / Recommendation Clinical Impression  Pt was encountered awake/alert, lying semi-reclined in bed.  Pt reported that he "going home at 12:00 today" and he perseverated on this phrase throughout the session.  RN reported that a palliative consult had been placed to discuss [place].  Pt denied dysphagia and he was re-educated regarding his severe dysphagia, significant aspiration risk, and MBS results.  Pt was additionally educated regarding the importance of oral care.  He refused oral care prior to ice chip trials; however, he was agreeable following trials.  Pt consumed trials of small ice chips x15 and he was instructed to complete effortful swallows, targeting pharyngeal strengthening.  He was successful in approximately half of the trials given moderate verbal cues.  Per observation and palpation, pt was had consistent hyolaryngeal elevation, but reduced excursion which is consistent with MBS results.  No clinical s/sx of aspiration were observed; however, pt was cued to clear his throat after every 5 trials secondary to known aspiration risk.  As pt remains full code, recommend continuation of NPO with frequent oral care at this time due to the pt's severity of dysphagia and aspiration risk.     HPI HPI: Kenneth Matthews is a 78 y.o. male with medical history significant of hypertension on amlodipine and metoprolol presented with SOB.  EMS found him in complete AV block.Had elevated BNP and TP.       SLP Plan  Continue with current plan of care       Recommendations  Diet recommendations: NPO(unless accepting aspiration risk ) Medication Administration: Via alternative means                Oral Care Recommendations: Oral care QID;Oral care prior to ice  chip/H20 Follow up Recommendations: Outpatient SLP SLP Visit Diagnosis: Dysphagia, pharyngoesophageal phase (R13.14);Dysphagia, oropharyngeal phase (R13.12) Plan: Continue with current plan of care                      Colin Mulders., M.S., [place] Office: [phone]  [place] 09/03/2019, 8:38 AM

## 2019-09-04 DIAGNOSIS — I0981 Rheumatic heart failure: Secondary | ICD-10-CM | POA: Diagnosis not present

## 2019-09-04 DIAGNOSIS — I442 Atrioventricular block, complete: Secondary | ICD-10-CM | POA: Diagnosis not present

## 2019-09-04 DIAGNOSIS — R001 Bradycardia, unspecified: Secondary | ICD-10-CM | POA: Diagnosis not present

## 2019-09-04 DIAGNOSIS — Z48812 Encounter for surgical aftercare following surgery on the circulatory system: Secondary | ICD-10-CM | POA: Diagnosis not present

## 2019-09-04 DIAGNOSIS — I5031 Acute diastolic (congestive) heart failure: Secondary | ICD-10-CM | POA: Diagnosis not present

## 2019-09-04 DIAGNOSIS — N179 Acute kidney failure, unspecified: Secondary | ICD-10-CM | POA: Diagnosis not present

## 2019-09-04 DIAGNOSIS — Z950 Presence of cardiac pacemaker: Secondary | ICD-10-CM | POA: Diagnosis not present

## 2019-09-04 DIAGNOSIS — N189 Chronic kidney disease, unspecified: Secondary | ICD-10-CM | POA: Diagnosis not present

## 2019-09-04 DIAGNOSIS — I130 Hypertensive heart and chronic kidney disease with heart failure and stage 1 through stage 4 chronic kidney disease, or unspecified chronic kidney disease: Secondary | ICD-10-CM | POA: Diagnosis not present

## 2019-09-04 DIAGNOSIS — I252 Old myocardial infarction: Secondary | ICD-10-CM | POA: Diagnosis not present

## 2019-09-06 DIAGNOSIS — N179 Acute kidney failure, unspecified: Secondary | ICD-10-CM | POA: Diagnosis not present

## 2019-09-06 DIAGNOSIS — I252 Old myocardial infarction: Secondary | ICD-10-CM | POA: Diagnosis not present

## 2019-09-06 DIAGNOSIS — I130 Hypertensive heart and chronic kidney disease with heart failure and stage 1 through stage 4 chronic kidney disease, or unspecified chronic kidney disease: Secondary | ICD-10-CM | POA: Diagnosis not present

## 2019-09-06 DIAGNOSIS — N189 Chronic kidney disease, unspecified: Secondary | ICD-10-CM | POA: Diagnosis not present

## 2019-09-06 DIAGNOSIS — I442 Atrioventricular block, complete: Secondary | ICD-10-CM | POA: Diagnosis not present

## 2019-09-06 DIAGNOSIS — I5031 Acute diastolic (congestive) heart failure: Secondary | ICD-10-CM | POA: Diagnosis not present

## 2019-09-06 DIAGNOSIS — I0981 Rheumatic heart failure: Secondary | ICD-10-CM | POA: Diagnosis not present

## 2019-09-06 DIAGNOSIS — Z48812 Encounter for surgical aftercare following surgery on the circulatory system: Secondary | ICD-10-CM | POA: Diagnosis not present

## 2019-09-06 DIAGNOSIS — Z950 Presence of cardiac pacemaker: Secondary | ICD-10-CM | POA: Diagnosis not present

## 2019-09-06 DIAGNOSIS — R001 Bradycardia, unspecified: Secondary | ICD-10-CM | POA: Diagnosis not present

## 2019-09-07 ENCOUNTER — Other Ambulatory Visit: Payer: Self-pay | Admitting: *Deleted

## 2019-09-07 DIAGNOSIS — N179 Acute kidney failure, unspecified: Secondary | ICD-10-CM | POA: Diagnosis not present

## 2019-09-07 DIAGNOSIS — Z950 Presence of cardiac pacemaker: Secondary | ICD-10-CM | POA: Diagnosis not present

## 2019-09-07 DIAGNOSIS — Z48812 Encounter for surgical aftercare following surgery on the circulatory system: Secondary | ICD-10-CM | POA: Diagnosis not present

## 2019-09-07 DIAGNOSIS — I252 Old myocardial infarction: Secondary | ICD-10-CM | POA: Diagnosis not present

## 2019-09-07 DIAGNOSIS — I5031 Acute diastolic (congestive) heart failure: Secondary | ICD-10-CM | POA: Diagnosis not present

## 2019-09-07 DIAGNOSIS — I130 Hypertensive heart and chronic kidney disease with heart failure and stage 1 through stage 4 chronic kidney disease, or unspecified chronic kidney disease: Secondary | ICD-10-CM | POA: Diagnosis not present

## 2019-09-07 DIAGNOSIS — I442 Atrioventricular block, complete: Secondary | ICD-10-CM | POA: Diagnosis not present

## 2019-09-07 DIAGNOSIS — I0981 Rheumatic heart failure: Secondary | ICD-10-CM | POA: Diagnosis not present

## 2019-09-07 DIAGNOSIS — N189 Chronic kidney disease, unspecified: Secondary | ICD-10-CM | POA: Diagnosis not present

## 2019-09-07 DIAGNOSIS — R001 Bradycardia, unspecified: Secondary | ICD-10-CM | POA: Diagnosis not present

## 2019-09-07 NOTE — Patient Outreach (Signed)
Red EMMI general discharge 2 flags received day # 1 on 09/05/19, Scheduled follow up appointment- no,  Other questions, problems- yes. Ooutreach call to pt to address, spoke with pt and he prefers RN CM speak with his spouse [name], per spouse she did make the follow up appointment for 09/14/19 with primary care provider,  PT is working with pt and spouse is going to speak with PT about the most appropriate type of wheelchair for pt and will also speak with MD about it at 09/14/19 visit.  No further concerns voiced.  Jacqlyn Larsen [facility], [place] Coordinator [phone]

## 2019-09-08 ENCOUNTER — Emergency Department (HOSPITAL_COMMUNITY): Payer: Medicare HMO

## 2019-09-08 ENCOUNTER — Inpatient Hospital Stay (HOSPITAL_COMMUNITY): Payer: Medicare HMO

## 2019-09-08 ENCOUNTER — Inpatient Hospital Stay (HOSPITAL_COMMUNITY)
Admission: EM | Admit: 2019-09-08 | Discharge: 2019-09-15 | DRG: 871 | Disposition: E | Payer: Medicare HMO | Attending: Internal Medicine | Admitting: Internal Medicine

## 2019-09-08 DIAGNOSIS — Z950 Presence of cardiac pacemaker: Secondary | ICD-10-CM

## 2019-09-08 DIAGNOSIS — R6521 Severe sepsis with septic shock: Secondary | ICD-10-CM | POA: Diagnosis present

## 2019-09-08 DIAGNOSIS — Z681 Body mass index (BMI) 19 or less, adult: Secondary | ICD-10-CM

## 2019-09-08 DIAGNOSIS — Z66 Do not resuscitate: Secondary | ICD-10-CM | POA: Diagnosis not present

## 2019-09-08 DIAGNOSIS — E43 Unspecified severe protein-calorie malnutrition: Secondary | ICD-10-CM | POA: Diagnosis not present

## 2019-09-08 DIAGNOSIS — J690 Pneumonitis due to inhalation of food and vomit: Secondary | ICD-10-CM | POA: Diagnosis not present

## 2019-09-08 DIAGNOSIS — I21A1 Myocardial infarction type 2: Secondary | ICD-10-CM | POA: Diagnosis not present

## 2019-09-08 DIAGNOSIS — E785 Hyperlipidemia, unspecified: Secondary | ICD-10-CM | POA: Diagnosis present

## 2019-09-08 DIAGNOSIS — I959 Hypotension, unspecified: Secondary | ICD-10-CM | POA: Diagnosis not present

## 2019-09-08 DIAGNOSIS — Z20828 Contact with and (suspected) exposure to other viral communicable diseases: Secondary | ICD-10-CM | POA: Diagnosis not present

## 2019-09-08 DIAGNOSIS — I499 Cardiac arrhythmia, unspecified: Secondary | ICD-10-CM | POA: Diagnosis not present

## 2019-09-08 DIAGNOSIS — N400 Enlarged prostate without lower urinary tract symptoms: Secondary | ICD-10-CM | POA: Diagnosis present

## 2019-09-08 DIAGNOSIS — Z87891 Personal history of nicotine dependence: Secondary | ICD-10-CM | POA: Diagnosis not present

## 2019-09-08 DIAGNOSIS — I442 Atrioventricular block, complete: Secondary | ICD-10-CM | POA: Diagnosis not present

## 2019-09-08 DIAGNOSIS — I5033 Acute on chronic diastolic (congestive) heart failure: Secondary | ICD-10-CM | POA: Diagnosis present

## 2019-09-08 DIAGNOSIS — R0689 Other abnormalities of breathing: Secondary | ICD-10-CM | POA: Diagnosis not present

## 2019-09-08 DIAGNOSIS — J449 Chronic obstructive pulmonary disease, unspecified: Secondary | ICD-10-CM | POA: Diagnosis present

## 2019-09-08 DIAGNOSIS — I82411 Acute embolism and thrombosis of right femoral vein: Secondary | ICD-10-CM | POA: Diagnosis present

## 2019-09-08 DIAGNOSIS — R0902 Hypoxemia: Secondary | ICD-10-CM | POA: Diagnosis not present

## 2019-09-08 DIAGNOSIS — N183 Chronic kidney disease, stage 3 unspecified: Secondary | ICD-10-CM | POA: Diagnosis present

## 2019-09-08 DIAGNOSIS — R0609 Other forms of dyspnea: Secondary | ICD-10-CM | POA: Diagnosis not present

## 2019-09-08 DIAGNOSIS — R0600 Dyspnea, unspecified: Secondary | ICD-10-CM

## 2019-09-08 DIAGNOSIS — T17908A Unspecified foreign body in respiratory tract, part unspecified causing other injury, initial encounter: Secondary | ICD-10-CM

## 2019-09-08 DIAGNOSIS — Z515 Encounter for palliative care: Secondary | ICD-10-CM | POA: Diagnosis not present

## 2019-09-08 DIAGNOSIS — J189 Pneumonia, unspecified organism: Secondary | ICD-10-CM

## 2019-09-08 DIAGNOSIS — I130 Hypertensive heart and chronic kidney disease with heart failure and stage 1 through stage 4 chronic kidney disease, or unspecified chronic kidney disease: Secondary | ICD-10-CM | POA: Diagnosis present

## 2019-09-08 DIAGNOSIS — R0602 Shortness of breath: Secondary | ICD-10-CM

## 2019-09-08 DIAGNOSIS — J9690 Respiratory failure, unspecified, unspecified whether with hypoxia or hypercapnia: Secondary | ICD-10-CM | POA: Diagnosis not present

## 2019-09-08 DIAGNOSIS — N179 Acute kidney failure, unspecified: Secondary | ICD-10-CM | POA: Diagnosis not present

## 2019-09-08 DIAGNOSIS — A419 Sepsis, unspecified organism: Secondary | ICD-10-CM | POA: Diagnosis not present

## 2019-09-08 DIAGNOSIS — J9601 Acute respiratory failure with hypoxia: Secondary | ICD-10-CM | POA: Diagnosis present

## 2019-09-08 DIAGNOSIS — I330 Acute and subacute infective endocarditis: Secondary | ICD-10-CM

## 2019-09-08 DIAGNOSIS — I509 Heart failure, unspecified: Secondary | ICD-10-CM | POA: Diagnosis present

## 2019-09-08 LAB — SODIUM, URINE, RANDOM: Sodium, Ur: 45 mmol/L

## 2019-09-08 LAB — GLUCOSE, CAPILLARY
Glucose-Capillary: 126 mg/dL — ABNORMAL HIGH (ref 70–99)
Glucose-Capillary: 127 mg/dL — ABNORMAL HIGH (ref 70–99)
Glucose-Capillary: 130 mg/dL — ABNORMAL HIGH (ref 70–99)
Glucose-Capillary: 111 mg/dL — ABNORMAL HIGH (ref 70–99)

## 2019-09-08 LAB — CBC
HCT: 41.5 % (ref 39.0–52.0)
Hemoglobin: 13.3 g/dL (ref 13.0–17.0)
MCH: 30.6 pg (ref 26.0–34.0)
MCHC: 32 g/dL (ref 30.0–36.0)
MCV: 95.4 fL (ref 80.0–100.0)
Platelets: 205 10*3/uL (ref 150–400)
RBC: 4.35 MIL/uL (ref 4.22–5.81)
RDW: 15.1 % (ref 11.5–15.5)
WBC: 18.5 10*3/uL — ABNORMAL HIGH (ref 4.0–10.5)
nRBC: 0 % (ref 0.0–0.2)

## 2019-09-08 LAB — PROCALCITONIN: Procalcitonin: 1.44 ng/mL

## 2019-09-08 LAB — POCT I-STAT 7, (LYTES, BLD GAS, ICA,H+H)
Acid-base deficit: 10 mmol/L — ABNORMAL HIGH (ref 0.0–2.0)
Acid-base deficit: 7 mmol/L — ABNORMAL HIGH (ref 0.0–2.0)
Bicarbonate: 14.9 mmol/L — ABNORMAL LOW (ref 20.0–28.0)
Bicarbonate: 17.5 mmol/L — ABNORMAL LOW (ref 20.0–28.0)
Calcium, Ion: 1.2 mmol/L (ref 1.15–1.40)
Calcium, Ion: 1.17 mmol/L (ref 1.15–1.40)
HCT: 39 % (ref 39.0–52.0)
HCT: 34 % — ABNORMAL LOW (ref 39.0–52.0)
Hemoglobin: 13.3 g/dL (ref 13.0–17.0)
Hemoglobin: 11.6 g/dL — ABNORMAL LOW (ref 13.0–17.0)
O2 Saturation: 87 %
O2 Saturation: 100 %
Patient temperature: 98.6
Patient temperature: 101
Potassium: 5.2 mmol/L — ABNORMAL HIGH (ref 3.5–5.1)
Potassium: 4.3 mmol/L (ref 3.5–5.1)
Sodium: 140 mmol/L (ref 135–145)
Sodium: 139 mmol/L (ref 135–145)
TCO2: 16 mmol/L — ABNORMAL LOW (ref 22–32)
TCO2: 18 mmol/L — ABNORMAL LOW (ref 22–32)
pCO2 arterial: 29.6 mmHg — ABNORMAL LOW (ref 32.0–48.0)
pCO2 arterial: 32.5 mmHg (ref 32.0–48.0)
pH, Arterial: 7.308 — ABNORMAL LOW (ref 7.350–7.450)
pH, Arterial: 7.345 — ABNORMAL LOW (ref 7.350–7.450)
pO2, Arterial: 58 mmHg — ABNORMAL LOW (ref 83.0–108.0)
pO2, Arterial: 384 mmHg — ABNORMAL HIGH (ref 83.0–108.0)

## 2019-09-08 LAB — BASIC METABOLIC PANEL
Anion gap: 15 (ref 5–15)
Anion gap: 13 (ref 5–15)
BUN: 87 mg/dL — ABNORMAL HIGH (ref 8–23)
BUN: 86 mg/dL — ABNORMAL HIGH (ref 8–23)
CO2: 15 mmol/L — ABNORMAL LOW (ref 22–32)
CO2: 18 mmol/L — ABNORMAL LOW (ref 22–32)
Calcium: 8.7 mg/dL — ABNORMAL LOW (ref 8.9–10.3)
Calcium: 8.5 mg/dL — ABNORMAL LOW (ref 8.9–10.3)
Chloride: 112 mmol/L — ABNORMAL HIGH (ref 98–111)
Chloride: 109 mmol/L (ref 98–111)
Creatinine, Ser: 3.71 mg/dL — ABNORMAL HIGH (ref 0.61–1.24)
Creatinine, Ser: 3.75 mg/dL — ABNORMAL HIGH (ref 0.61–1.24)
GFR calc Af Amer: 17 mL/min — ABNORMAL LOW (ref >60)
GFR calc Af Amer: 17 mL/min — ABNORMAL LOW (ref >60)
GFR calc non Af Amer: 15 mL/min — ABNORMAL LOW (ref >60)
GFR calc non Af Amer: 14 mL/min — ABNORMAL LOW (ref >60)
Glucose, Bld: 152 mg/dL — ABNORMAL HIGH (ref 70–99)
Glucose, Bld: 142 mg/dL — ABNORMAL HIGH (ref 70–99)
Potassium: 5 mmol/L (ref 3.5–5.1)
Potassium: 4.8 mmol/L (ref 3.5–5.1)
Sodium: 142 mmol/L (ref 135–145)
Sodium: 140 mmol/L (ref 135–145)

## 2019-09-08 LAB — PROTIME-INR
INR: 1.4 — ABNORMAL HIGH (ref 0.8–1.2)
Prothrombin Time: 16.7 seconds — ABNORMAL HIGH (ref 11.4–15.2)

## 2019-09-08 LAB — PROTEIN / CREATININE RATIO, URINE
Creatinine, Urine: 144.3 mg/dL
Protein Creatinine Ratio: 0.85 mg/mg{Cre} — ABNORMAL HIGH (ref 0.00–0.15)
Total Protein, Urine: 123 mg/dL

## 2019-09-08 LAB — HEPATIC FUNCTION PANEL
ALT: 31 U/L (ref 0–44)
AST: 43 U/L — ABNORMAL HIGH (ref 15–41)
Albumin: 2.8 g/dL — ABNORMAL LOW (ref 3.5–5.0)
Alkaline Phosphatase: 95 U/L (ref 38–126)
Bilirubin, Direct: 0.2 mg/dL (ref 0.0–0.2)
Indirect Bilirubin: 0.7 mg/dL (ref 0.3–0.9)
Total Bilirubin: 0.9 mg/dL (ref 0.3–1.2)
Total Protein: 7.6 g/dL (ref 6.5–8.1)

## 2019-09-08 LAB — URINALYSIS, ROUTINE W REFLEX MICROSCOPIC
Bilirubin Urine: NEGATIVE
Glucose, UA: NEGATIVE mg/dL
Ketones, ur: NEGATIVE mg/dL
Leukocytes,Ua: NEGATIVE
Nitrite: NEGATIVE
Protein, ur: 100 mg/dL — AB
Specific Gravity, Urine: 1.025 (ref 1.005–1.030)
pH: 5.5 (ref 5.0–8.0)

## 2019-09-08 LAB — PHOSPHORUS
Phosphorus: 3.6 mg/dL (ref 2.5–4.6)
Phosphorus: 3.9 mg/dL (ref 2.5–4.6)
Phosphorus: 3.7 mg/dL (ref 2.5–4.6)

## 2019-09-08 LAB — RESPIRATORY PANEL BY PCR

## 2019-09-08 LAB — LACTIC ACID, PLASMA
Lactic Acid, Venous: 4.1 mmol/L (ref 0.5–1.9)
Lactic Acid, Venous: 2.3 mmol/L (ref 0.5–1.9)

## 2019-09-08 LAB — ECHOCARDIOGRAM LIMITED
Height: 71 in
Weight: 1844.81 oz

## 2019-09-08 LAB — STREP PNEUMONIAE URINARY ANTIGEN: Strep Pneumo Urinary Antigen: NEGATIVE

## 2019-09-08 LAB — CBC WITH DIFFERENTIAL/PLATELET
Abs Immature Granulocytes: 0.16 10*3/uL — ABNORMAL HIGH (ref 0.00–0.07)
Basophils Absolute: 0 10*3/uL (ref 0.0–0.1)
Basophils Relative: 0 %
Eosinophils Absolute: 0 10*3/uL (ref 0.0–0.5)
Eosinophils Relative: 0 %
HCT: 45.2 % (ref 39.0–52.0)
Hemoglobin: 14.4 g/dL (ref 13.0–17.0)
Immature Granulocytes: 1 %
Lymphocytes Relative: 3 %
Lymphs Abs: 0.6 10*3/uL — ABNORMAL LOW (ref 0.7–4.0)
MCH: 30.8 pg (ref 26.0–34.0)
MCHC: 31.9 g/dL (ref 30.0–36.0)
MCV: 96.6 fL (ref 80.0–100.0)
Monocytes Absolute: 0.5 10*3/uL (ref 0.1–1.0)
Monocytes Relative: 3 %
Neutro Abs: 16 10*3/uL — ABNORMAL HIGH (ref 1.7–7.7)
Neutrophils Relative %: 93 %
Platelets: 184 10*3/uL (ref 150–400)
RBC: 4.68 MIL/uL (ref 4.22–5.81)
RDW: 15 % (ref 11.5–15.5)
WBC: 17.3 10*3/uL — ABNORMAL HIGH (ref 4.0–10.5)
nRBC: 0 % (ref 0.0–0.2)

## 2019-09-08 LAB — URINALYSIS, MICROSCOPIC (REFLEX)

## 2019-09-08 LAB — BRAIN NATRIURETIC PEPTIDE: B Natriuretic Peptide: 747.8 pg/mL — ABNORMAL HIGH (ref 0.0–100.0)

## 2019-09-08 LAB — SARS CORONAVIRUS 2 BY RT PCR (HOSPITAL ORDER, PERFORMED IN ~~LOC~~ HOSPITAL LAB): SARS Coronavirus 2: NEGATIVE

## 2019-09-08 LAB — HEPARIN LEVEL (UNFRACTIONATED): Heparin Unfractionated: <0.1 IU/mL — ABNORMAL LOW (ref 0.30–0.70)

## 2019-09-08 LAB — CBG MONITORING, ED: Glucose-Capillary: 147 mg/dL — ABNORMAL HIGH (ref 70–99)

## 2019-09-08 LAB — TROPONIN I (HIGH SENSITIVITY)
Troponin I (High Sensitivity): 1431 ng/L (ref <18)
Troponin I (High Sensitivity): 5292 ng/L (ref <18)
Troponin I (High Sensitivity): 6454 ng/L (ref <18)
Troponin I (High Sensitivity): 7040 ng/L (ref <18)

## 2019-09-08 LAB — MAGNESIUM
Magnesium: 1.8 mg/dL (ref 1.7–2.4)
Magnesium: 1.6 mg/dL — ABNORMAL LOW (ref 1.7–2.4)
Magnesium: 1.9 mg/dL (ref 1.7–2.4)

## 2019-09-08 LAB — POC SARS CORONAVIRUS 2 AG -  ED: SARS Coronavirus 2 Ag: NEGATIVE

## 2019-09-08 LAB — APTT: aPTT: 26 seconds (ref 24–36)

## 2019-09-08 MED ORDER — ETOMIDATE 2 MG/ML IV SOLN
INTRAVENOUS | Status: AC | PRN
  Administered 2019-09-08: 20 mg via INTRAVENOUS

## 2019-09-08 MED ORDER — FENTANYL 2500MCG IN NS 250ML (10MCG/ML) PREMIX INFUSION
25.0000 ug/h | INTRAVENOUS | Status: DC
  Administered 2019-09-08: 07:00:00 25 ug/h via INTRAVENOUS
  Administered 2019-09-09: 150 ug/h via INTRAVENOUS
  Administered 2019-09-09: 50 ug/h via INTRAVENOUS
  Administered 2019-09-10: 200 ug/h via INTRAVENOUS
  Filled 2019-09-08 (×3): qty 250

## 2019-09-08 MED ORDER — CHLORHEXIDINE GLUCONATE 0.12% ORAL RINSE (MEDLINE KIT)
15.0000 mL | Freq: Two times a day (BID) | OROMUCOSAL | Status: DC
  Administered 2019-09-08 – 2019-09-10 (×5): 15 mL via OROMUCOSAL

## 2019-09-08 MED ORDER — MIDAZOLAM HCL 2 MG/2ML IJ SOLN
1.0000 mg | INTRAMUSCULAR | Status: DC | PRN
  Administered 2019-09-10 (×2): 1 mg via INTRAVENOUS
  Filled 2019-09-08 (×2): qty 2

## 2019-09-08 MED ORDER — MAGNESIUM SULFATE 2 GM/50ML IV SOLN
2.0000 g | Freq: Once | INTRAVENOUS | Status: AC
  Administered 2019-09-08: 2 g via INTRAVENOUS
  Filled 2019-09-08: qty 50

## 2019-09-08 MED ORDER — SODIUM CHLORIDE 0.9 % IV SOLN
1.0000 g | INTRAVENOUS | Status: DC
  Administered 2019-09-08: 23:00:00 1 g via INTRAVENOUS
  Filled 2019-09-08 (×2): qty 1

## 2019-09-08 MED ORDER — VITAL 1.5 CAL PO LIQD
1000.0000 mL | ORAL | Status: DC
  Filled 2019-09-08: qty 1000

## 2019-09-08 MED ORDER — IPRATROPIUM-ALBUTEROL 0.5-2.5 (3) MG/3ML IN SOLN
3.0000 mL | RESPIRATORY_TRACT | Status: DC
  Administered 2019-09-08 – 2019-09-10 (×14): 3 mL via RESPIRATORY_TRACT
  Filled 2019-09-08 (×14): qty 3

## 2019-09-08 MED ORDER — MIDAZOLAM HCL 2 MG/2ML IJ SOLN
1.0000 mg | INTRAMUSCULAR | Status: AC | PRN
  Administered 2019-09-08 – 2019-09-10 (×3): 1 mg via INTRAVENOUS
  Filled 2019-09-08 (×3): qty 2

## 2019-09-08 MED ORDER — PRO-STAT SUGAR FREE PO LIQD
30.0000 mL | Freq: Two times a day (BID) | ORAL | Status: DC
  Administered 2019-09-08: 30 mL
  Filled 2019-09-08: qty 30

## 2019-09-08 MED ORDER — LACTATED RINGERS IV SOLN
INTRAVENOUS | Status: AC
  Administered 2019-09-08: 11:00:00 via INTRAVENOUS

## 2019-09-08 MED ORDER — DEXMEDETOMIDINE HCL IN NACL 400 MCG/100ML IV SOLN
0.4000 ug/kg/h | INTRAVENOUS | Status: DC
  Administered 2019-09-08: 0.4 ug/kg/h via INTRAVENOUS
  Administered 2019-09-08: 09:00:00 0.6 ug/kg/h via INTRAVENOUS
  Filled 2019-09-08 (×2): qty 100

## 2019-09-08 MED ORDER — ROCURONIUM BROMIDE 50 MG/5ML IV SOLN
INTRAVENOUS | Status: AC | PRN
  Administered 2019-09-08: 100 mg via INTRAVENOUS

## 2019-09-08 MED ORDER — PANTOPRAZOLE SODIUM 40 MG IV SOLR
40.0000 mg | Freq: Every day | INTRAVENOUS | Status: DC
  Administered 2019-09-08: 40 mg via INTRAVENOUS
  Filled 2019-09-08: qty 40

## 2019-09-08 MED ORDER — DOCUSATE SODIUM 50 MG/5ML PO LIQD: 100.0000 mg | Freq: Two times a day (BID) | ORAL | Status: DC | PRN

## 2019-09-08 MED ORDER — FENTANYL CITRATE (PF) 100 MCG/2ML IJ SOLN
25.0000 ug | INTRAMUSCULAR | Status: AC | PRN
  Administered 2019-09-08: 12.5 ug via INTRAVENOUS
  Administered 2019-09-08 – 2019-09-09 (×2): 25 ug via INTRAVENOUS
  Filled 2019-09-08: qty 2

## 2019-09-08 MED ORDER — SODIUM CHLORIDE 0.9 % IV BOLUS (SEPSIS): 500.0000 mL | Freq: Once | INTRAVENOUS | Status: DC

## 2019-09-08 MED ORDER — CHLORHEXIDINE GLUCONATE CLOTH 2 % EX PADS
6.0000 | MEDICATED_PAD | Freq: Every day | CUTANEOUS | Status: DC
  Administered 2019-09-09 – 2019-09-10 (×2): 6 via TOPICAL

## 2019-09-08 MED ORDER — BISACODYL 10 MG RE SUPP: 10.0000 mg | Freq: Every day | RECTAL | Status: DC | PRN

## 2019-09-08 MED ORDER — ORAL CARE MOUTH RINSE
15.0000 mL | OROMUCOSAL | Status: DC
  Administered 2019-09-08 – 2019-09-10 (×20): 15 mL via OROMUCOSAL

## 2019-09-08 MED ORDER — VANCOMYCIN HCL IN DEXTROSE 1-5 GM/200ML-% IV SOLN
1000.0000 mg | Freq: Once | INTRAVENOUS | Status: AC
  Administered 2019-09-08: 1000 mg via INTRAVENOUS
  Filled 2019-09-08: qty 200

## 2019-09-08 MED ORDER — VITAL HIGH PROTEIN PO LIQD
1000.0000 mL | ORAL | Status: DC
  Administered 2019-09-08: 1000 mL

## 2019-09-08 MED ORDER — FENTANYL CITRATE (PF) 100 MCG/2ML IJ SOLN
25.0000 ug | INTRAMUSCULAR | Status: DC | PRN
  Administered 2019-09-08: 06:00:00 75 ug via INTRAVENOUS
  Filled 2019-09-08: qty 2

## 2019-09-08 MED ORDER — SODIUM CHLORIDE 0.9 % IV SOLN
2.0000 g | Freq: Once | INTRAVENOUS | Status: AC
  Administered 2019-09-08: 03:00:00 2 g via INTRAVENOUS
  Filled 2019-09-08: qty 2

## 2019-09-08 MED ORDER — VITAL AF 1.2 CAL PO LIQD
1000.0000 mL | ORAL | Status: DC
  Administered 2019-09-08 – 2019-09-09 (×2): 1000 mL

## 2019-09-08 MED ORDER — SODIUM CHLORIDE 0.9 % IV BOLUS (SEPSIS)
1000.0000 mL | Freq: Once | INTRAVENOUS | Status: AC
  Administered 2019-09-08: 1000 mL via INTRAVENOUS

## 2019-09-08 MED ORDER — VANCOMYCIN VARIABLE DOSE PER UNSTABLE RENAL FUNCTION (PHARMACIST DOSING): Status: DC

## 2019-09-08 MED ORDER — HEPARIN (PORCINE) 25000 UT/250ML-% IV SOLN
1300.0000 [IU]/h | INTRAVENOUS | Status: DC
  Administered 2019-09-08: 750 [IU]/h via INTRAVENOUS
  Administered 2019-09-09: 1100 [IU]/h via INTRAVENOUS
  Administered 2019-09-10: 05:00:00 1300 [IU]/h via INTRAVENOUS
  Filled 2019-09-08 (×3): qty 250

## 2019-09-08 MED ORDER — ALBUTEROL SULFATE (2.5 MG/3ML) 0.083% IN NEBU: 2.5000 mg | INHALATION_SOLUTION | RESPIRATORY_TRACT | Status: DC | PRN

## 2019-09-08 NOTE — Progress Notes (Deleted)
ANTICOAGULATION CONSULT NOTE - Initial Consult  Pharmacy Consult for Heparin Indication: R/O PE   No Known Allergies  Patient Measurements: Height: 5\' 11"  (180.3 cm) Weight: 115 lb 4.8 oz (52.3 kg) IBW/kg (Calculated) : 75.3  Vital Signs: Temp: 98.1 F (36.7 C) (11/24 0800) Temp Source: Axillary (11/24 0800) BP: 78/46 (11/24 1030) Pulse Rate: 95 (11/24 1030)  Labs: Recent Labs    08/19/2019 0215 08/24/2019 0216 08/18/2019 0412 09/04/2019 0623 08/21/2019 1033 09/05/2019 1224  HGB 14.4 13.3 11.6* 13.3  --   --   HCT 45.2 39.0 34.0* 41.5  --   --   PLT 184  --   --  205  --   --   APTT 26  --   --   --   --   --   LABPROT 16.7*  --   --   --   --   --   INR 1.4*  --   --   --   --   --   HEPARINUNFRC  --   --   --   --   --  <0.10*  CREATININE 3.71*  --   --  3.75*  --   --   TROPONINIHS 1,431*  --   --  5,292* 6,454*  --     Estimated Creatinine Clearance: 11.8 mL/min (A) (by C-G formula based on SCr of 3.75 mg/dL (H)).   Medical History: Past Medical History:  Diagnosis Date  . Hypertension     Medications:  No current facility-administered medications on file prior to encounter.    Current Outpatient Medications on File Prior to Encounter  Medication Sig Dispense Refill  . albuterol (VENTOLIN HFA) 108 (90 Base) MCG/ACT inhaler Inhale 2 puffs into the lungs every 4 (four) hours as needed for wheezing or shortness of breath.    Marland Kitchen amLODipine (NORVASC) 5 MG tablet Take 5 mg by mouth daily.    Marland Kitchen atorvastatin (LIPITOR) 20 MG tablet Take 20 mg by mouth at bedtime.    . metoprolol tartrate (LOPRESSOR) 25 MG tablet Take 12.5 mg by mouth every morning.    . tamsulosin (FLOMAX) 0.4 MG CAPS capsule Take 0.4 mg by mouth daily.      Assessment: 78 y.o. male admitted with shortness of breath s/p recent pacemaker placement 11/16, r/o PE.   CT with contrast not performed likely due to concomitant AKI. BLE ultrasound and TTE ordered if negative can d/c heparin per CCM.  HL returned  subtherapuetic at <0.10 Hg, PLT ok. No bolus per CCM. Dosing with TBW since pt <125% IBW.  Goal of Therapy:  Heparin level 0.3-0.7 units/ml Monitor platelets by anticoagulation protocol: Yes   Plan:  Increase heparin to 1000 units/hr Check heparin level in 8 hours. Monitor for signs and symptoms of bleeding, Hg, PLT.  Kavish Lafitte 09/03/2019,1:15 PM

## 2019-09-08 NOTE — Plan of Care (Signed)
  Interdisciplinary Goals of Care Family Meeting   Date carried out:: 09/04/2019  Location of the meeting: Unit  Member's involved: Physician, Bedside Registered Nurse and Family Member or next of kin  Durable Power of Attorney or acting medical decision maker: The patient's son Kenneth Matthews  Discussion: We discussed goals of care for Kenneth Matthews .  Family is very concerned about his suffering and quality of life. They understand that while his acute conditions are treatable, his dysphagia and aspiration are likely not. Kenneth Matthews is clear there is to be no escalation of care. DNR if he should arrest. No pressors/lines. Should he decline, Kenneth Matthews would want to pursue comfort measures.   Code status: Limited Code or DNR with short term and Mechanical ventilation  Disposition: Continue current acute care  Time spent for the meeting: 35 mins  Corey Harold 08/21/2019, 1:12 PM

## 2019-09-08 NOTE — Progress Notes (Signed)
Just spoke with the patient's son, Mikki Santee on the phone. He states he would like to be the primary point of contact.   Dewaine Oats, RN

## 2019-09-08 NOTE — H&P (Signed)
NAME:  Kenneth Matthews, MRN:  [id number], DOB:  1941/04/01, LOS: 0 ADMISSION DATE:  2019-10-04, CONSULTATION DATE:  10/04/19 REFERRING MD:  Dr. Dina Rich, CHIEF COMPLAINT:  Resp failure  Brief History   78 yoM with prior hx of recent pacemaker placement 11/16 for complete AV block and noted to have severe dysphagia with failed MBS presenting from home with SOB and acute respiratory hypoxic failure requiring intubation.   History of present illness   HPI obtained from medical chart review as patient is intubated on mechanical ventilation.   78 year old male with history of HTN, HLD, COPD, former smoker, BPH, complete heart block s/p pacemaker, and dysphagia presenting from home with sudden onset of shortness of breath found to be hypotensive and hypoxic.  Recent hospitalization 11/13 to 11/19.  COVID negative 11/14 with pacemaker placement on 11/16 for complete heart block.  There was concern for seizure like activity with Neurology consulting however felt that episodes were related to hypoperfusion during bradycardic episodes.   Also noted to AKI and dysphagia with failed MBS with high risk of aspiration with recommendations to keep NPO.  Was seen by palliative care medicine for goals of care discussion where patient verbalized that eating contributed to his quality of life, did not want any means of artifical feeding or tubes, and accepted the risk to continue eating.   In ER, patient had denied prior fevers or cough.  Did admit to several day history of worsening shortness of breath but then had acute decompensation this morning.  Found to be febrile with soft blood pressure.  Initially required increasing amounts of oxygen with labored breathing.  CXR showing bilateral infiltrates, greater on right.  He was treated with 30 cc/kg bolus and started on empiric vancomycin and cefepime.  Given worsening respiratory distress and patient wishes to remain a full code, he was intubated.  Labs noted for WBC  17.3, BUN 87, sCr 3.71 (on 11/19 was 1.6), BNP 747, hs-trop 1431, EKG AV paced.  COVID test pending and remains on airborne and contact precautions.  PCCM called for admit.   Past Medical History  HTN, HLD, COPD, BPH  Significant Hospital Events   11/13 to 11/19 hospitalized/ pacemaker placed 11/24 Admitted   Consults:   Procedures:  11/24 ETT >>  Significant Diagnostic Tests:   Micro Data:  11/24 BCx 2 >> 11/24 trach asp >> 11/24 SARS CoV2 >> neg 11/24 UC >>  11/24 RVP >> 11/24 urine strep >> 11/24 urine legionella >>  Antimicrobials:  11/24 vanc >> 11/24 cefepime >>  Interim history/subjective:   Objective   Blood pressure (!) 167/63, pulse 60, temperature (!) 101 F (38.3 C), temperature source Rectal, resp. rate 18, SpO2 100 %.    Vent Mode: PRVC FiO2 (%):  [100 %] 100 % Set Rate:  [18 bmp] 18 bmp Vt Set:  [600 mL] 600 mL PEEP:  [5 cmH20] 5 cmH20 Plateau Pressure:  [21 cmH20-24 cmH20] 24 cmH20   Intake/Output Summary (Last 24 hours) at 10/04/19 0335 Last data filed at 10-04-19 0149 Gross per 24 hour  Intake 500 ml  Output -  Net 500 ml   There were no vitals filed for this visit.  Examination: Per Dr. Jonnie Finner as patient at time of examination on airborne and contact precautions for rule out [facility] Problem list    Assessment & Plan:   Acute hypoxic respiratory failure with bilateral infiltrates (R >L) with ddx of aspiration vs HCAP; less likely  PE but still in differential  Hx COPD, doubt AE Hx of Dysphagia - - failed MBS on 11/17, with recs for NPO.  PMT consulted where patient felt his quality of life depended on his ability to eat and did not want any artifical means of nutrition or feeding tube at that time and was adamant to go home knowing high risk of complications and aspiration risk.  P:  - Full MV support, PRVC 8 cc/kg, rate 18 - ABG now - VAP bundle - duonebs q 4hr and albuterol PRN - Rapid COVID neg.  Will send  RVP and trach asp - Given sudden acute nature of hypoxic respiratory failure, PE must be in differential. Wells score indeterminate, BNP and hs trop elevated (but were on last admit as well).  Unable to complete CTA given AKI, therefore will empirically start heparin gtt for now.  Check TTE and BLE [place] to r/o DVT, if negative and no signs of right heart strain, can d/c heparin gtt given low probability and symptoms likely attributed to aspiration.  - NPO - Will need to address ongoing GOC moving forward    Sepsis - likely related to PNA, UA and BC pending - recent pacemaker placement- site wnl P: - Send trach asp, RVP, urine legionella/ strep - Follow Culture data - Continue vanc and cefepime - assess PCT  - Check TTE in am  - Trend WBC/ fever curve    AKI - prior hx of chronic bladder outlet obstruction P:  - Foley - Check urine Na - S/p 1.5 L NS  - Trend BMP / urinary output - Replace electrolytes as indicated - Avoid nephrotoxic agents, ensure adequate renal perfusion   Elevated BNP/ hs trop Hx HTN/ HLD - was borderline hypotensive, currently normotensive - currently AV paced - prior TTE limited, but EF normal  P:  - Tele monitoring - Trend hs- trop - TTE in am  - Hold home norvasc, lopressor and lipitor   Hx complete AV block s/p pacemaker placement 08/31/2019 P:  - S/p pacemaker  Protein calore malnutrition P:  Start TF Ongoing GOC conversations given prior dysphagia and patient's prior wishes to continue eating despite risk  Best practice:  Diet: NPO Pain/Anxiety/Delirium protocol (if indicated): prn fentanyl/ versed for RASS goal 0/-1 VAP protocol (if indicated): yes DVT prophylaxis: heparin gtt GI prophylaxis: PPI Glucose control: CBG q 4, SSI sentive Mobility: BR Code Status: full, will need to address GOC with family - has 2 sons and a significant other of 35 yrs per prior PMT notes  Family Communication: Pending, son not currently at  bedside Disposition: admit to ICU  Labs   CBC: Recent Labs  Lab 09/03/19 0500 09/04/2019 0215 09/01/2019 0216  WBC 7.8 17.3*  --   NEUTROABS  --  16.0*  --   HGB 13.1 14.4 13.3  HCT 40.3 45.2 39.0  MCV 95.0 96.6  --   PLT 205 184  --     Basic Metabolic Panel: Recent Labs  Lab 09/02/19 0527 09/03/19 0500 08/16/2019 0215 08/19/2019 0216  NA 149* 150* 142 140  K 3.6 4.0 5.0 5.2*  CL 116* 118* 112*  --   CO2 22 22 15*  --   GLUCOSE 117* 121* 152*  --   BUN 55* 45* 87*  --   CREATININE 1.72* 1.60* 3.71*  --   CALCIUM 8.7* 8.6* 8.7*  --    GFR: Estimated Creatinine Clearance: 11.3 mL/min (A) (by C-G formula based on SCr  of 3.71 mg/dL (H)). Recent Labs  Lab 09/03/19 0500 2019-10-06 0215  WBC 7.8 17.3*  LATICACIDVEN  --  4.1*    Liver Function Tests: Recent Labs  Lab 2019-10-06 0215  AST 43*  ALT 31  ALKPHOS 95  BILITOT 0.9  PROT 7.6  ALBUMIN 2.8*   No results for input(s): LIPASE, AMYLASE in the last 168 hours. No results for input(s): AMMONIA in the last 168 hours.  ABG    Component Value Date/Time   PHART 7.308 (L) 2020-09-2219 0216   PCO2ART 29.6 (L) 2020-09-2219 0216   PO2ART 58.0 (L) 2020-09-2219 0216   HCO3 14.9 (L) 2020-09-2219 0216   TCO2 16 (L) 2020-09-2219 0216   ACIDBASEDEF 10.0 (H) 2020-09-2219 0216   O2SAT 87.0 2020-09-2219 0216     Coagulation Profile: Recent Labs  Lab 2019-10-06 0215  INR 1.4*    Cardiac Enzymes: No results for input(s): CKTOTAL, CKMB, CKMBINDEX, TROPONINI in the last 168 hours.  HbA1C: No results found for: HGBA1C  CBG: Recent Labs  Lab 2019-10-06 0220  GLUCAP 147*    Review of Systems:   Unable as patient is intubated on MV.   Past Medical History  He,  has a past medical history of Hypertension.   Surgical History    Past Surgical History:  Procedure Laterality Date  . PACEMAKER IMPLANT N/A 08/31/2019   Procedure: PACEMAKER IMPLANT;  Surgeon: Marinus Mawaylor, Gregg W, MD;  [facility];  Service:  Cardiovascular;  Laterality: N/A;  . TEMPORARY PACEMAKER N/A 08/29/2019   Procedure: TEMPORARY PACEMAKER;  Surgeon: Kathleene HazelMcAlhany, Christopher D, MD;  Location: [facility] INVASIVE CV LAB;  Service: Cardiovascular;  Laterality: N/A;     Social History   reports that he has quit smoking. He has never used smokeless tobacco.   Family History   His family history is not on file.   Allergies Not on File   Home Medications  Prior to Admission medications   Medication Sig Start Date End Date Taking? Authorizing Provider  albuterol (VENTOLIN HFA) 108 (90 Base) MCG/ACT inhaler Inhale 2 puffs into the lungs every 4 (four) hours as needed for wheezing or shortness of breath. 08/06/19   [provider]  amLODipine (NORVASC) 5 MG tablet Take 5 mg by mouth daily. 08/24/19   [provider]  atorvastatin (LIPITOR) 20 MG tablet Take 20 mg by mouth at bedtime. 07/06/19   [provider]  metoprolol tartrate (LOPRESSOR) 25 MG tablet Take 12.5 mg by mouth every morning. 07/13/19   [provider]  tamsulosin (FLOMAX) 0.4 MG CAPS capsule Take 0.4 mg by mouth daily. 08/24/19   [provider]     Critical care time: 60 mins     Posey BoyerBrooke Jaydien Panepinto, MSN, AGACNP-BC [facility] 2020-09-2219, 4:46 AM

## 2019-09-08 NOTE — Progress Notes (Signed)
Bilateral lower extremity venous duplex complete.  Please see CV Proc tab for preliminary results. Critical findings reported to patients RN, Cori, at bedside @ 12:30.  [facility] will contact doctor. Lita Mains- RDMS, RVT 12:41 PM  10/01/2019

## 2019-09-08 NOTE — Progress Notes (Signed)
Patient transported from ED Room 21 to 4N82 with no complications.

## 2019-09-08 NOTE — Progress Notes (Signed)
Pharmacy Antibiotic Note  Kenneth Matthews is a 78 y.o. male admitted on September 10, 2019 with SOB and AKI,  possible sepsis.  Pharmacy has been consulted for Vancomycin and Cefepime  Dosing.  Vancomycin 1 g IV given in ED at  [name]: F/U renal function and redose Vancomycin as indicated Cefepime 1 g IV q24h    Temp (24hrs), Avg:101 F (38.3 C), Min:101 F (38.3 C), Max:101 F (38.3 C)  Recent Labs  Lab 09/02/19 0527 09/03/19 0500 08/29/2019 0215  WBC  --  7.8 17.3*  CREATININE 1.72* 1.60* 3.71*  LATICACIDVEN  --   --  4.1*    Estimated Creatinine Clearance: 11.3 mL/min (A) (by C-G formula based on SCr of 3.71 mg/dL (H)).    Not on File   Caryl Pina 09/06/2019 4:25 AM

## 2019-09-08 NOTE — ED Triage Notes (Addendum)
Pt from home c/o sob, reported to be sudden onset. Initial sats in the 80s. EMS reported hypotension with blood pressures as low as 01U systolic. Given 543ml NS enroute. 18 G IV in L upper arm. Pt tachypneic, oxygen sats in mid 80s on 5L [place]. EMS also reported pt c/o L sided chest discomfort. Clammy to touch   Recent pacemaker placement on 11/16; covid negativ on 08/29/19

## 2019-09-08 NOTE — Plan of Care (Signed)
MICU Attestation  Patient seen and examined and relevant ancillary tests reviewed.  I agree with the assessment and plan of care as outlined by Jennelle Human, NP. This patient was not seen as a shared visit. The following reflects my independent critical care time.  Kenneth Matthews is a 78 y.o. male with recent admission for CHB s/p PPM placement who presented with fever, hypoxia, increased work of breathing and hypotension. Covid negative x2. CXR shows dense consolidation in right upper lobe and bilateral patchy opacities consistent with multifocal pneumonia. He was intubated for acute hypoxic respiratory failure while in the ED.  Blood pressure (!) 122/59, pulse (!) 59, temperature (!) 101 F (38.3 C), temperature source Rectal, resp. rate 20, SpO2 100 %.   On exam, he is intubated, probably still with residual paralytic in his system. Pupils 4 mm bilaterally, with brisk reactivity on left, more sluggish on right. Decreased BS at right base to right mid lung. Crackles at left base. No erythema around PPM site. RRR, no m/r/g. Abdomen scaphoid, non-distended, NABS. No peripheral edema.  1. Acute hypoxic respiratory failure in the setting of multifocal pneumonia 2. Severe sepsis 3. Acute kidney injury 4. Complete heart block with recent PPM placement 5. Lactic acidosis 6. Troponinemia  -On ventilator, target 6-8 cc/kg IBW, repeat ABG post-intubation -PRN fentanyl for sedation, target RASS 0 to -1 -Continue vanc, cefepime given recent hospitalization -Follow up blood culture, obtain tracheal aspirate, respiratory viral panel -Hold off on diuresis given acute renal injury and initial hypotension, reassess in AM after volume resuscitation -Obtain echocardiogram to assess EF, r/o endocarditis given recent PPM placement -Obtain Duplex of BLE, not unreasonable to treat empirically for possible PE -Trend troponin -Trend lactate, ensure downtrending  This patient is critically ill with multiple organ  system failure; which, requires frequent high complexity decision making, assessment, support, evaluation, and titration of therapies. This was completed through the application of advanced monitoring technologies and extensive interpretation of multiple databases. During this encounter critical care time was devoted to patient care services described in this note for 50 minutes.  Otelia Limes, MD 08/24/2019 4:07 AM

## 2019-09-08 NOTE — Progress Notes (Addendum)
NAME:  Kenneth Matthews, MRN:  [id number], DOB:  30-Nov-1940, LOS: 0 ADMISSION DATE:  2019-09-18, CONSULTATION DATE:  September 18, 2019 REFERRING MD:  Dr. Wilkie Aye, CHIEF COMPLAINT:  Resp failure  Brief History   49 yoM with prior hx of recent pacemaker placement 11/16 for complete AV block and noted to have severe dysphagia with failed MBS presenting from home with SOB and acute respiratory hypoxic failure requiring intubation.   Past Medical History  HTN, HLD, COPD, BPH  Significant Hospital Events   11/13 to 11/19 hospitalized/ pacemaker placed 11/24 Admitted   Consults:   Procedures:  11/24 ETT >>  Significant Diagnostic Tests:   Micro Data:  11/24 BCx 2 >> 11/24 trach asp >> 11/24 SARS CoV2 >> neg 11/24 UC >>  11/24 RVP >> 11/24 urine strep > neg 11/24 urine legionella >>  Antimicrobials:  11/24 vanc >> 11/24 cefepime >>  Interim history/subjective:  No acute events overnight. Thick secretions  Objective   Blood pressure (!) 81/43, pulse (!) 113, temperature 100.2 F (37.9 C), temperature source Bladder, resp. rate 14, height 5\' 11"  (1.803 m), weight 52.3 kg, SpO2 94 %.    Vent Mode: PRVC FiO2 (%):  [40 %-100 %] 40 % Set Rate:  [18 bmp-20 bmp] 20 bmp Vt Set:  [600 mL] 600 mL PEEP:  [5 cmH20] 5 cmH20 Plateau Pressure:  [21 cmH20-24 cmH20] 24 cmH20   Intake/Output Summary (Last 24 hours) at 09/18/19 0734 Last data filed at Sep 18, 2019 0700 Gross per 24 hour  Intake 1799.31 ml  Output -  Net 1799.31 ml   Filed Weights   09-18-2019 0600  Weight: 52.3 kg    Examination: General:  Elderly frail male on vent Neuro:  Alert, follows commands HEENT:  [place]/AT, No JVD noted, PERRL Cardiovascular:  Regular, tachycardic Lungs:  Coarse bilaterally Abdomen:  Soft, non-distended Musculoskeletal:  No acute deformity, no edema.  Skin:  Intact, MMM   Resolved Hospital Problem list    Assessment & Plan:   Acute hypoxic respiratory failure with bilateral infiltrates:  Suspected aspiration vs HCAP; less likely PE but still in differential. He has known dysphagia and failed MBS on 11/17, with recs for NPO.  PMT consulted where patient felt his quality of life depended on his ability to eat and did not want any artifical means of nutrition or feeding tube at that time and was adamant to go home knowing high risk of complications and aspiration risk.  - Full MV support, low tidal volume ventilation - Not ready for wean this morning. Failed.  - VAP bundle - Follow RVP and trach asp - NPO - Will need to address ongoing GOC moving forward  - Empiric antibiotics - Fentanyl and precedex infusion for RASS goal 0 to -2  COPD without acute exacerbation. - duonebs q 4hr and albuterol PRN  Possible PE: Hypoxia and dyspnea of very acute on set with recent hospitalization. Wells score indeterminate, BNP and hs trop elevated (but were on last admit as well).  - Heparin infusion - Check TTE and BLE 12/17 to r/o DVT. If both are not consistent with VTE would dc heparin.   Sepsis: in the setting of pneumonia likely although other cultures pending. Recent PPM site looks good.  - Follow culture data - Continue vanc and cefepime - Check TTE in am - Trend WBC/ fever curve  AKI - prior hx of chronic bladder outlet obstruction P:  - Foley - Trend BMP / urinary output  Troponinemia (HS troponin 5k)  Hx HTN/ HLD - currently AV paced P:  - Tele monitoring - Continue heparin - Consult cardiology once stabilizes - Trend hs-trop - TTE in am  - Hold home norvasc, lopressor and lipitor   Hx complete AV block s/p pacemaker placement 08/31/2019 P:  - monitor  Protein calorie malnutrition P:  Start TF Family endorses recent change in decisions. Patient would want feeding tube placed. Once he stabilizes we can try to arrange this while he is on vent.  Ongoing GOC conversations given prior dysphagia and patient's prior wishes to continue eating despite risk [place] today   Best practice:  Diet: NPO Pain/Anxiety/Delirium protocol (if indicated): Fentanyl/precedex for RASS goal 0/-1 VAP protocol (if indicated): yes DVT prophylaxis: heparin gtt GI prophylaxis: PPI Glucose control: CBG q 4, SSI sentive Mobility: BR Code Status: full, will need to address [place] with family - has 2 sons and a significant other of 35 yrs per prior PMT notes  Family Communication: Wife updated Disposition: admit to ICU  Labs   CBC: Recent Labs  Lab 09/03/19 0500 09/07/2019 0215 08/21/2019 0216 09/02/2019 0412 08/17/2019 0623  WBC 7.8 17.3*  --   --  18.5*  NEUTROABS  --  16.0*  --   --   --   HGB 13.1 14.4 13.3 11.6* 13.3  HCT 40.3 45.2 39.0 34.0* 41.5  MCV 95.0 96.6  --   --  95.4  PLT 205 184  --   --  093    Basic Metabolic Panel: Recent Labs  Lab 09/02/19 0527 09/03/19 0500 09/03/2019 0215 08/20/2019 0216 08/16/2019 0412 09/02/2019 0623  NA 149* 150* 142 140 139 140  K 3.6 4.0 5.0 5.2* 4.3 4.8  CL 116* 118* 112*  --   --  109  CO2 22 22 15*  --   --  18*  GLUCOSE 117* 121* 152*  --   --  142*  BUN 55* 45* 87*  --   --  86*  CREATININE 1.72* 1.60* 3.71*  --   --  3.75*  CALCIUM 8.7* 8.6* 8.7*  --   --  8.5*  MG  --   --   --   --   --  1.8  PHOS  --   --   --   --   --  3.6   GFR: Estimated Creatinine Clearance: 11.8 mL/min (A) (by C-G formula based on SCr of 3.75 mg/dL (H)). Recent Labs  Lab 09/03/19 0500 09/02/2019 0215 09/12/2019 0623  WBC 7.8 17.3* 18.5*  LATICACIDVEN  --  4.1* 2.3*    Liver Function Tests: Recent Labs  Lab 08/24/2019 0215  AST 43*  ALT 31  ALKPHOS 95  BILITOT 0.9  PROT 7.6  ALBUMIN 2.8*   No results for input(s): LIPASE, AMYLASE in the last 168 hours. No results for input(s): AMMONIA in the last 168 hours.  ABG    Component Value Date/Time   PHART 7.345 (L) 09/03/2019 0412   PCO2ART 32.5 08/18/2019 0412   PO2ART 384.0 (H) 08/25/2019 0412   HCO3 17.5 (L) 09/01/2019 0412   TCO2 18 (L) 09/11/2019 0412   ACIDBASEDEF 7.0 (H)  09/01/2019 0412   O2SAT 100.0 08/24/2019 0412     Coagulation Profile: Recent Labs  Lab 08/16/2019 0215  INR 1.4*    Cardiac Enzymes: No results for input(s): CKTOTAL, CKMB, CKMBINDEX, TROPONINI in the last 168 hours.  HbA1C: No results found for: HGBA1C  CBG: Recent Labs  Lab 08/23/2019 0220 09/03/2019 2355  GLUCAP 147* 126*    Review of Systems:   Unable as patient is intubated on MV.   Past Medical History  He,  has a past medical history of Hypertension.   Surgical History    Past Surgical History:  Procedure Laterality Date  . PACEMAKER IMPLANT N/A 08/31/2019   Procedure: PACEMAKER IMPLANT;  Surgeon: Marinus Mawaylor, Gregg W, MD;  [facility];  Service: Cardiovascular;  Laterality: N/A;  . TEMPORARY PACEMAKER N/A 08/29/2019   Procedure: TEMPORARY PACEMAKER;  Surgeon: Kathleene HazelMcAlhany, Christopher D, MD;  Location: [facility];  Service: Cardiovascular;  Laterality: N/A;     Social History   reports that he has quit smoking. He has never used smokeless tobacco.   Family History   His family history is not on file.   Allergies Not on File   Home Medications  Prior to Admission medications   Medication Sig Start Date End Date Taking? Authorizing Provider  albuterol (VENTOLIN HFA) 108 (90 Base) MCG/ACT inhaler Inhale 2 puffs into the lungs every 4 (four) hours as needed for wheezing or shortness of breath. 08/06/19   [provider]  amLODipine (NORVASC) 5 MG tablet Take 5 mg by mouth daily. 08/24/19   [provider]  atorvastatin (LIPITOR) 20 MG tablet Take 20 mg by mouth at bedtime. 07/06/19   [provider]  metoprolol tartrate (LOPRESSOR) 25 MG tablet Take 12.5 mg by mouth every morning. 07/13/19   [provider]  tamsulosin (FLOMAX) 0.4 MG CAPS capsule Take 0.4 mg by mouth daily. 08/24/19   [provider]     Critical care time: 45  mins     Joneen RoachPaul Hoffman, AGACNP-BC [facility]   See Amion for personal pager PCCM on call pager [phone]  09/05/2019 8:36 AM

## 2019-09-08 NOTE — Progress Notes (Signed)
Initial Nutrition Assessment  DOCUMENTATION CODES:   Severe malnutrition in context of chronic illness, Underweight  INTERVENTION:   Tube Feeding:  Vital AF 1.2 at 60 ml/hr Begin at 20 ml/hr; titrate by 10 mL q 8 hours until goal rate of 60 ml/hr Provides 1728 kcals, 108 g of protein and 1166 mL of free water  Monitor magnesium, potassium, and phosphorus for at least 5 occurrences, MD to replete as needed, as pt is at risk for refeeding syndrome given severe malnutrition, underweight.  NUTRITION DIAGNOSIS:   Severe Malnutrition related to chronic illness as evidenced by severe fat depletion, severe muscle depletion.  GOAL:   Patient will meet greater than or equal to 90% of their needs  MONITOR:   Vent status, TF tolerance, Labs, Weight trends  REASON FOR ASSESSMENT:   Consult, Ventilator Enteral/tube feeding initiation and management  ASSESSMENT:   78 yo male admitted with acute respiratory failure secondary to aspiration pneumonia requiring intubation, AKI on CKD III. PMH includes HTN, HLD, COPD   Patient is currently intubated on ventilator support MV: 12.4 L/min Temp (24hrs), Avg:99.4 F (37.4 C), Min:98 F (36.7 C), Max:101 F (38.3 C)  Noted code status changed to DNR with no escalation of care. Plan for comfort measures if pt should deline  Pt with recent admission, RD at that time recommending PEG placement; pt underweight and met clinical characteristics for severe malnutrition at that time. NFPE severe in all areas upon exam today.   Unable to obtain diet and weight history  Labs: reviewed Meds: reviewed   NUTRITION - FOCUSED PHYSICAL EXAM:  Severe in all areas  Diet Order:   Diet Order            Diet NPO time specified  Diet effective now              EDUCATION NEEDS:   Not appropriate for education at this time  Skin:  Skin Assessment: Reviewed RN Assessment  Last BM:  no documented BM  Height:   Ht Readings from Last 1  Encounters:  09/14/2019 '5\' 11"'$  (1.803 m)    Weight:   Wt Readings from Last 1 Encounters:  08/16/2019 52.3 kg    Ideal Body Weight:  78.2 kg  BMI:  Body mass index is 16.08 kg/m.  Estimated Nutritional Needs:   Kcal:  1778 kcals  Protein:  78-104 g  Fluid:  >/= 1.7 L    [employer] MS, RDN, LDN, CNSC [phone] Pager  [phone] Weekend/On-Call Pager

## 2019-09-08 NOTE — Progress Notes (Signed)
eLink Physician-Brief Progress Note Patient Name: Kenneth Matthews DOB: 10-Jul-1941 MRN: [id number]   Date of Service  September 25, 2019  HPI/Events of Note  Pt resting peacefully in bed at the time observed him via camera.  eICU Interventions  No new orders.        [name] September 25, 2019, 11:15 PM

## 2019-09-08 NOTE — Progress Notes (Signed)
eLink Physician-Brief Progress Note Patient Name: Kenneth Matthews DOB: Mar 03, 1941 MRN: [id number]   Date of Service  28-Sep-2019  HPI/Events of Note  87 M recent pacemaker insertion for complete heart block. COVID negative at that time (11/14). He presents this time in respiratory distress now intubated. In the ED he was hypotensive and febrile. CXR with bilateral opacities. COVID negative.  eICU Interventions  Continue antibiotics for pneumonia. 2D echo to rule out pacemaker lead vegetation        Judd Lien 09-28-2019, 6:50 AM

## 2019-09-08 NOTE — Progress Notes (Signed)
ANTICOAGULATION CONSULT NOTE - Initial Consult  Pharmacy Consult for Heparin Indication: R/O PE  Not on File  Patient Measurements:   Heparin Dosing Weight: 50 kg  Vital Signs: Temp: 101 F (38.3 C) (11/24 0249) Temp Source: Rectal (11/24 0249) BP: 100/47 (11/24 0417) Pulse Rate: 59 (11/24 0417)  Labs: Recent Labs    09/19/2019 0215 September 19, 2019 0216 09-19-19 0412  HGB 14.4 13.3 11.6*  HCT 45.2 39.0 34.0*  PLT 184  --   --   APTT 26  --   --   LABPROT 16.7*  --   --   INR 1.4*  --   --   CREATININE 3.71*  --   --   TROPONINIHS 1,431*  --   --     Estimated Creatinine Clearance: 11.3 mL/min (A) (by C-G formula based on SCr of 3.71 mg/dL (H)).   Medical History: Past Medical History:  Diagnosis Date  . Hypertension     Medications:  No current facility-administered medications on file prior to encounter.    Current Outpatient Medications on File Prior to Encounter  Medication Sig Dispense Refill  . albuterol (VENTOLIN HFA) 108 (90 Base) MCG/ACT inhaler Inhale 2 puffs into the lungs every 4 (four) hours as needed for wheezing or shortness of breath.    Marland Kitchen amLODipine (NORVASC) 5 MG tablet Take 5 mg by mouth daily.    Marland Kitchen atorvastatin (LIPITOR) 20 MG tablet Take 20 mg by mouth at bedtime.    . metoprolol tartrate (LOPRESSOR) 25 MG tablet Take 12.5 mg by mouth every morning.    . tamsulosin (FLOMAX) 0.4 MG CAPS capsule Take 0.4 mg by mouth daily.      Assessment: 78 y.o. male admitted with shortness of breath s/p recent PPM 11/16, possible PE, for heparin  Goal of Therapy:  Heparin level 0.3-0.7 units/ml Monitor platelets by anticoagulation protocol: Yes   Plan:  Start heparin 750 units/hr Check heparin level in 8 hours.   Kenneth Matthews September 19, 2019,4:19 AM

## 2019-09-08 NOTE — ED Provider Notes (Signed)
[facility] EMERGENCY DEPARTMENT Provider Note   CSN: [id number] Arrival date & time: 08/25/2019  0143     History   Chief Complaint Chief Complaint  Patient presents with  . Shortness of Breath    HPI Kenneth Matthews is a 78 y.o. male.     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  Patient brought in by [facility].  Reportedly hypotensive and hypoxic in route.  He does not wear home oxygen.  Patient reports several day history of worsening shortness of breath.  Particularly when he lays flat.  No lower extremity edema.  He denies chest pain.  He denies any recent coughs or fevers.  History is fairly limited secondary to patient acuity.  Level 5 caveat.  Chart review with temporary pacemaker placed on 11/14 followed by a permanent pacemaker on 11/16.  Negative Covid testing on that admission.  Echo with ejection fraction of 65 to 70%.  Past Medical History:  Diagnosis Date  . Hypertension     Patient Active Problem List   Diagnosis Date Noted  . Protein-calorie malnutrition, severe 08/31/2019  . Renal insufficiency   . Acute diastolic CHF (congestive heart failure) ([place])   . Third degree heart block ([place]) 08/29/2019  . Acute CHF (congestive heart failure) ([place]) 08/29/2019  . Essential hypertension 08/29/2019  . AKI (acute kidney injury) ([place]) 08/29/2019  . Elevated troponin I level 08/29/2019  . Acute respiratory failure with hypoxemia ([place]) 08/29/2019    Past Surgical History:  Procedure Laterality Date  . PACEMAKER IMPLANT N/A 08/31/2019   Procedure: PACEMAKER IMPLANT;  Surgeon: Evans Lance, MD;  Location: [place] [facility];  Service: Cardiovascular;  Laterality: N/A;  . TEMPORARY PACEMAKER N/A 08/29/2019   Procedure: TEMPORARY PACEMAKER;  Surgeon: Burnell Blanks, MD;   Location: [place] [facility];  Service: Cardiovascular;  Laterality: N/A;        Home Medications    Prior to Admission medications   Medication Sig Start Date End Date Taking? Authorizing Provider  albuterol (VENTOLIN HFA) 108 (90 Base) MCG/ACT inhaler Inhale 2 puffs into the lungs every 4 (four) hours as needed for wheezing or shortness of breath. 08/06/19   [provider]  amLODipine (NORVASC) 5 MG tablet Take 5 mg by mouth daily. 08/24/19   [provider]  atorvastatin (LIPITOR) 20 MG tablet Take 20 mg by mouth at bedtime. 07/06/19   [provider]  metoprolol tartrate (LOPRESSOR) 25 MG tablet Take 12.5 mg by mouth every morning. 07/13/19   [provider]  tamsulosin (FLOMAX) 0.4 MG CAPS capsule Take 0.4 mg by mouth daily. 08/24/19   [provider]    Family History No family history on file.  Social History Social History   Tobacco Use  . Smoking status: Former Research scientist (life sciences)  . Smokeless tobacco: Never Used  Substance Use Topics  . Alcohol use: Not on file  . Drug use: Not on file     Allergies   Patient has no allergy information on record.   Review of Systems Review of Systems  Constitutional: Negative for fever.  Respiratory: Positive for shortness of breath. Negative for cough.   All other systems reviewed and are negative.    Physical Exam Updated Vital Signs BP (!) 105/44   Pulse (!) 57   Resp 12   SpO2 Marland Kitchen)  86%   Physical Exam Vitals signs and nursing note reviewed.  Constitutional:      Comments: Increased work of breathing, tachypnea noted, frail and chronically ill-appearing, cachectic  HENT:     Head: Normocephalic and atraumatic.     Mouth/Throat:     Mouth: Mucous membranes are dry.  Eyes:     Pupils: Pupils are equal, round, and reactive to light.  Neck:     Musculoskeletal: Neck supple.  Cardiovascular:     Rate and Rhythm: Normal rate and regular rhythm.     Heart sounds: No murmur.      Comments: Pacemaker in her left upper chest Pulmonary:     Effort: Tachypnea, accessory muscle usage and respiratory distress present.     Breath sounds: Examination of the right-middle field reveals rales. Examination of the right-lower field reveals rales. Examination of the left-lower field reveals rales. Rales present. No wheezing.     Comments: Increased work of breathing, accessory muscle use, tachypnea noted Abdominal:     General: Bowel sounds are normal.     Palpations: Abdomen is soft.     Tenderness: There is no abdominal tenderness. There is no rebound.  Musculoskeletal:     Right lower leg: No edema.     Left lower leg: No edema.  Skin:    General: Skin is warm and dry.  Neurological:     Mental Status: He is oriented to person, place, and time.  Psychiatric:     Comments: Unable to assess      ED Treatments / Results  Labs (all labs ordered are listed, but only abnormal results are displayed) Labs Reviewed  CBC WITH DIFFERENTIAL/PLATELET - Abnormal; Notable for the following components:      Result Value   WBC 17.3 (*)    Neutro Abs 16.0 (*)    Lymphs Abs 0.6 (*)    Abs Immature Granulocytes 0.16 (*)    All other components within normal limits  POCT I-STAT 7, (LYTES, BLD GAS, ICA,H+H) - Abnormal; Notable for the following components:   pH, Arterial 7.308 (*)    pCO2 arterial 29.6 (*)    pO2, Arterial 58.0 (*)    Bicarbonate 14.9 (*)    TCO2 16 (*)    Acid-base deficit 10.0 (*)    Potassium 5.2 (*)    All other components within normal limits  CBG MONITORING, ED - Abnormal; Notable for the following components:   Glucose-Capillary 147 (*)    All other components within normal limits  CULTURE, BLOOD (ROUTINE X 2)  CULTURE, BLOOD (ROUTINE X 2)  URINE CULTURE  SARS CORONAVIRUS 2 BY RT PCR (HOSPITAL ORDER, [facility])  BRAIN NATRIURETIC PEPTIDE  BASIC METABOLIC PANEL  LACTIC ACID, PLASMA  LACTIC ACID, PLASMA  COMPREHENSIVE  METABOLIC PANEL  APTT  PROTIME-INR  URINALYSIS, ROUTINE W REFLEX MICROSCOPIC  POC SARS CORONAVIRUS 2 AG -  ED  I-STAT ARTERIAL BLOOD GAS, ED  TROPONIN I (HIGH SENSITIVITY)    EKG EKG Interpretation  Date/Time:  Tuesday September 08 2019 01:49:34 EST Ventricular Rate:  66 PR Interval:    QRS Duration: 114 QT Interval:  520 QTC Calculation: 545 R Axis:   74 Text Interpretation: Atrial-sensed ventricular-paced rhythm No further analysis attempted due to paced rhythm Confirmed by Thayer Jew [phone]) on 08/25/2019 2:13:50 AM   Radiology Dg Chest Portable 1 View  Result Date: 09/01/2019 CLINICAL DATA:  Shortness of breath EXAM: PORTABLE CHEST 1 VIEW COMPARISON:  09/01/2019  FINDINGS: Cardiac shadow is stable. Pacing device is again seen. Aortic calcifications are noted. Interval worsening of bilateral patchy infiltrates particularly on right with fluid within the minor fissures. No bony abnormality is noted. IMPRESSION: Worsening bilateral infiltrates particularly on the right. Electronically Signed   By: Inez Catalina M.D.   On: 09/07/2019 02:12    Procedures .Critical Care Performed by: Merryl Hacker, MD Authorized by: Merryl Hacker, MD   Critical care provider statement:    Critical care time (minutes):  60   Critical care time was exclusive of:  Separately billable procedures and treating other patients   Critical care was necessary to treat or prevent imminent or life-threatening deterioration of the following conditions:  Shock, sepsis and respiratory failure   Critical care was time spent personally by me on the following activities:  Discussions with consultants, evaluation of patient's response to treatment, examination of patient, ordering and performing treatments and interventions, ordering and review of laboratory studies, ordering and review of radiographic studies, pulse oximetry, re-evaluation of patient's condition, obtaining history from patient or  surrogate and review of old charts   I assumed direction of critical care for this patient from another provider in my specialty: no   Procedure Name: Intubation Date/Time: 09/14/2019 2:46 AM Performed by: Merryl Hacker, MD Pre-anesthesia Checklist: Patient identified Oxygen Delivery Method: Non-rebreather mask Preoxygenation: Pre-oxygenation with 100% oxygen Induction Type: Rapid sequence Laryngoscope Size: Mac Grade View: Grade I Tube size: 7.5 mm Number of attempts: 1 Secured at: 26 cm Tube secured with: ETT holder Dental Injury: Teeth and Oropharynx as per pre-operative assessment       (including critical care time)  Medications Ordered in ED Medications  vancomycin (VANCOCIN) IVPB 1000 mg/200 mL premix (has no administration in time range)  ceFEPIme (MAXIPIME) 2 g in sodium chloride 0.9 % 100 mL IVPB (has no administration in time range)  sodium chloride 0.9 % bolus 1,000 mL (has no administration in time range)    And  sodium chloride 0.9 % bolus 500 mL (has no administration in time range)  etomidate (AMIDATE) injection (20 mg Intravenous Given 09/07/2019 0226)  rocuronium (ZEMURON) injection (100 mg Intravenous Given 09/09/2019 0227)     Initial Impression / Assessment and Plan / ED Course  I have reviewed the triage vital signs and the nursing notes.  Pertinent labs & imaging results that were available during my care of the patient were reviewed by me and considered in my medical decision making (see chart for details).        Patient presents with shortness of breath.  He is in acute respiratory distress.  Hypoxic.  He is able to provide some history but is fairly labored.  Considerations include pulmonary edema, pneumonia, Covid given current pandemic.  Initial work-up was obtained.  We were finally able to get a rectal temperature.  Temperature 101.  He also has had some soft initial blood pressure readings.  Sepsis work-up was initiated.  Patient given  cefepime and Vanco.  He was given a full 30 cc/kg fluid and his blood pressure was responsive.  I have reviewed his chest x-ray independently which is concerning for pneumonia.  Because of his respiratory distress, I did discuss with the patient his wishes.  He did report that he wanted to be full code.  He became progressively more air hungry and hypoxic.  For this reason he was intubated.  Spoke with the patient's son both on the phone and at bedside.  Reports that he has had some progressive shortness of breath over the last several days.  However, he reports general decline since his recent pacemaker placement.  He states that at baseline he takes care of himself.  We discussed ongoing care issues.  He is going to call his brother.  He reports that his father has had significantly decreased oral intake over the last week as well.  Final Clinical Impressions(s) / ED Diagnoses   Final diagnoses:  Acute respiratory failure with hypoxia ([place])  Septic shock ([place])  Healthcare-associated pneumonia  AKI (acute kidney injury) [facility])    ED Discharge Orders    None       Dina Rich, Barbette Hair, MD 09/02/2019 [phone]

## 2019-09-08 NOTE — TOC Initial Note (Addendum)
Transition of Care [facility]) - Initial/Assessment Note    Patient Details  Name: EMANI MORAD MRN: [id number] Date of Birth: Apr 07, 1941  [facility]) CM/SW Contact:    Bartholomew Crews, RN Phone Number: [phone] Sep 09, 2019, 1:55 PM  Clinical Narrative:                 Following chart and progression for transition needs. Patient with recent admit for pacemaker and dysphagia. Now admitted with respiratory failure and currently on vent. [place] discussion pending with provider and family. BP soft, may need pressors. Precedex infusing. TTE today.   Received call from [place] that patient is active for RN, PT, OT, ST. Patient will need HH orders for resumption of services at discharge. TOC team to assist with transition needs.   Expected Discharge Plan: [place] Barriers to Discharge: Continued Medical Work up   Patient Goals and CMS Choice        Expected Discharge Plan and Services Expected Discharge Plan: [place]                                              Prior Living Arrangements/Services                       Activities of Daily Living      Permission Sought/Granted                  Emotional Assessment              Admission diagnosis:  Healthcare-associated pneumonia [J18.9] Acute respiratory failure with hypoxia ([place]) [J96.01] AKI (acute kidney injury) ([place]) [N17.9] Septic shock ([place]) [A41.9, R65.21] Patient Active Problem List   Diagnosis Date Noted  . Acute respiratory failure with hypoxia ([place]) 09-Sep-2019  . Healthcare-associated pneumonia   . Aspiration into airway   . Protein-calorie malnutrition, severe 08/31/2019  . Renal insufficiency   . Acute diastolic CHF (congestive heart failure) ([place])   . Third degree heart block ([place]) 08/29/2019  . Acute CHF (congestive heart failure) ([place]) 08/29/2019  . Essential hypertension 08/29/2019  . AKI (acute kidney injury) ([place])  08/29/2019  . Elevated troponin I level 08/29/2019  . Acute respiratory failure with hypoxemia ([place]) 08/29/2019   PCP:  Neale Burly, MD Pharmacy:   [id number] - [place], [place] [address] [place] [address] Phone: [phone] Fax: [phone]     Social Determinants of Health (SDOH) Interventions    Readmission Risk Interventions No flowsheet data found.

## 2019-09-08 NOTE — ED Notes (Signed)
Critical care team at bedside

## 2019-09-08 NOTE — ED Notes (Signed)
Kenneth Matthews [phone] son is asking for an update

## 2019-09-08 NOTE — ED Notes (Signed)
Pt c/o worsening shob, placed on NRB, appears to be getting tired

## 2019-09-08 NOTE — ED Notes (Signed)
Dr. Dina Rich advised son (who has been with patient for the past few days) that he may stay at bedside since rapid covid test was negative.

## 2019-09-08 NOTE — Progress Notes (Signed)
  Echocardiogram 2D Echocardiogram has been performed.  Kenneth Matthews 2019/09/15, 1:48 PM

## 2019-09-08 NOTE — ED Notes (Signed)
Dr. Dina Rich speaking with patient's son at this time

## 2019-09-08 NOTE — Progress Notes (Addendum)
ANTICOAGULATION CONSULT NOTE  Pharmacy Consult for Heparin Indication: Rule out PE / ACS  No Known Allergies  Patient Measurements: Height: 5\' 11"  (180.3 cm) Weight: 115 lb 4.8 oz (52.3 kg) IBW/kg (Calculated) : 75.3 Heparin Dosing Weight: 50 kg  Vital Signs: Temp: 98.1 F (36.7 C) (11/24 0800) Temp Source: Axillary (11/24 0800) BP: 78/46 (11/24 1030) Pulse Rate: 95 (11/24 1030)  Labs: Recent Labs    08/26/2019 0215 08/25/2019 0216 08/19/2019 0412 09/07/2019 0623 08/17/2019 1033 08/28/2019 1224  HGB 14.4 13.3 11.6* 13.3  --   --   HCT 45.2 39.0 34.0* 41.5  --   --   PLT 184  --   --  205  --   --   APTT 26  --   --   --   --   --   LABPROT 16.7*  --   --   --   --   --   INR 1.4*  --   --   --   --   --   HEPARINUNFRC  --   --   --   --   --  <0.10*  CREATININE 3.71*  --   --  3.75*  --   --   TROPONINIHS 1,431*  --   --  5,292* 6,454* 7,040*    Estimated Creatinine Clearance: 11.8 mL/min (A) (by C-G formula based on SCr of 3.75 mg/dL (H)).    Assessment: 79 y.o. male admitted with shortness of breath s/p recent PPM 11/16.  Pharmacy consulted to dose IV heparin for possible PE and ACS.  Troponin increased to 7040 and Doppler showed an age indeterminate DVT.  Heparin level is undetectable but is 2 hours early.  No issue with heparin infusion per RN.  No bleeding reported.  Goal of Therapy:  Heparin level 0.3-0.7 units/ml Monitor platelets by anticoagulation protocol: Yes   Plan:  No bolus per MD Increase heparin gtt to 900 units/hr Check 8 hr heparin level  Raney Koeppen D. Mina Marble, PharmD, BCPS, [place] 08/19/2019, 1:22 PM

## 2019-09-09 ENCOUNTER — Inpatient Hospital Stay (HOSPITAL_COMMUNITY): Payer: Medicare HMO

## 2019-09-09 DIAGNOSIS — J9601 Acute respiratory failure with hypoxia: Secondary | ICD-10-CM | POA: Diagnosis not present

## 2019-09-09 LAB — URINE CULTURE: Culture: NO GROWTH

## 2019-09-09 LAB — CBC
HCT: 34.9 % — ABNORMAL LOW (ref 39.0–52.0)
Hemoglobin: 11.4 g/dL — ABNORMAL LOW (ref 13.0–17.0)
MCH: 30.7 pg (ref 26.0–34.0)
MCHC: 32.7 g/dL (ref 30.0–36.0)
MCV: 94.1 fL (ref 80.0–100.0)
Platelets: 197 10*3/uL (ref 150–400)
RBC: 3.71 MIL/uL — ABNORMAL LOW (ref 4.22–5.81)
RDW: 15.1 % (ref 11.5–15.5)
WBC: 16 10*3/uL — ABNORMAL HIGH (ref 4.0–10.5)
nRBC: 0 % (ref 0.0–0.2)

## 2019-09-09 LAB — BASIC METABOLIC PANEL
Anion gap: 14 (ref 5–15)
BUN: 95 mg/dL — ABNORMAL HIGH (ref 8–23)
CO2: 16 mmol/L — ABNORMAL LOW (ref 22–32)
Calcium: 8.1 mg/dL — ABNORMAL LOW (ref 8.9–10.3)
Chloride: 113 mmol/L — ABNORMAL HIGH (ref 98–111)
Creatinine, Ser: 3.24 mg/dL — ABNORMAL HIGH (ref 0.61–1.24)
GFR calc Af Amer: 20 mL/min — ABNORMAL LOW (ref >60)
GFR calc non Af Amer: 17 mL/min — ABNORMAL LOW (ref >60)
Glucose, Bld: 128 mg/dL — ABNORMAL HIGH (ref 70–99)
Potassium: 4.4 mmol/L (ref 3.5–5.1)
Sodium: 143 mmol/L (ref 135–145)

## 2019-09-09 LAB — GLUCOSE, CAPILLARY
Glucose-Capillary: 105 mg/dL — ABNORMAL HIGH (ref 70–99)
Glucose-Capillary: 107 mg/dL — ABNORMAL HIGH (ref 70–99)
Glucose-Capillary: 122 mg/dL — ABNORMAL HIGH (ref 70–99)
Glucose-Capillary: 126 mg/dL — ABNORMAL HIGH (ref 70–99)
Glucose-Capillary: 131 mg/dL — ABNORMAL HIGH (ref 70–99)
Glucose-Capillary: 137 mg/dL — ABNORMAL HIGH (ref 70–99)
Glucose-Capillary: 146 mg/dL — ABNORMAL HIGH (ref 70–99)

## 2019-09-09 LAB — HEPARIN LEVEL (UNFRACTIONATED)
Heparin Unfractionated: <0.1 IU/mL — ABNORMAL LOW (ref 0.30–0.70)
Heparin Unfractionated: 0.19 IU/mL — ABNORMAL LOW (ref 0.30–0.70)
Heparin Unfractionated: 0.42 IU/mL (ref 0.30–0.70)

## 2019-09-09 LAB — VANCOMYCIN, RANDOM: Vancomycin Rm: 10

## 2019-09-09 LAB — MAGNESIUM
Magnesium: 2.1 mg/dL (ref 1.7–2.4)
Magnesium: 2.1 mg/dL (ref 1.7–2.4)

## 2019-09-09 LAB — PHOSPHORUS
Phosphorus: 3.7 mg/dL (ref 2.5–4.6)
Phosphorus: 3.8 mg/dL (ref 2.5–4.6)

## 2019-09-09 MED ORDER — PANTOPRAZOLE SODIUM 40 MG PO PACK
40.0000 mg | PACK | Freq: Every day | ORAL | Status: DC
  Administered 2019-09-09 – 2019-09-10 (×2): 40 mg
  Filled 2019-09-09 (×2): qty 20

## 2019-09-09 MED ORDER — VANCOMYCIN HCL IN DEXTROSE 1-5 GM/200ML-% IV SOLN
1000.0000 mg | Freq: Once | INTRAVENOUS | Status: DC
  Filled 2019-09-09: qty 200

## 2019-09-09 MED ORDER — SODIUM CHLORIDE 0.9 % IV SOLN
1.5000 g | Freq: Two times a day (BID) | INTRAVENOUS | Status: DC
  Administered 2019-09-09 – 2019-09-10 (×3): 1.5 g via INTRAVENOUS
  Filled 2019-09-09 (×3): qty 4

## 2019-09-09 MED ORDER — SODIUM CHLORIDE 0.9 % IV SOLN
100.0000 mg | Freq: Two times a day (BID) | INTRAVENOUS | Status: DC
  Administered 2019-09-10: 08:00:00 100 mg via INTRAVENOUS
  Filled 2019-09-09 (×2): qty 100

## 2019-09-09 MED ORDER — DOXYCYCLINE HYCLATE 100 MG IV SOLR
200.0000 mg | Freq: Once | INTRAVENOUS | Status: AC
  Administered 2019-09-09: 22:00:00 200 mg via INTRAVENOUS
  Filled 2019-09-09: qty 200

## 2019-09-09 MED ORDER — ACETAMINOPHEN 325 MG PO TABS
650.0000 mg | ORAL_TABLET | Freq: Four times a day (QID) | ORAL | Status: DC | PRN
  Administered 2019-09-09 – 2019-09-10 (×2): 650 mg
  Filled 2019-09-09 (×2): qty 2

## 2019-09-09 MED ORDER — SODIUM CHLORIDE 0.9 % IV SOLN: INTRAVENOUS | Status: DC | PRN

## 2019-09-09 MED ORDER — SODIUM CHLORIDE 0.9 % IV SOLN: 2.0000 g | INTRAVENOUS | Status: DC

## 2019-09-09 MED ORDER — PHENYLEPHRINE HCL-NACL 10-0.9 MG/250ML-% IV SOLN
0.0000 ug/min | INTRAVENOUS | Status: DC
  Administered 2019-09-09 (×2): 25 ug/min via INTRAVENOUS
  Administered 2019-09-09: 01:00:00 20 ug/min via INTRAVENOUS
  Administered 2019-09-09 – 2019-09-10 (×2): 30 ug/min via INTRAVENOUS
  Filled 2019-09-09 (×5): qty 250

## 2019-09-09 NOTE — Progress Notes (Signed)
Patient transported from 3M11 to 3M05 on ventilator with no problems.  Ashley Mariner RRT

## 2019-09-09 NOTE — Progress Notes (Signed)
ANTICOAGULATION CONSULT NOTE  Pharmacy Consult for Heparin Indication: Rule out PE / ACS  No Known Allergies  Patient Measurements: Height: 5\' 11"  (180.3 cm) Weight: 121 lb 14.6 oz (55.3 kg) IBW/kg (Calculated) : 75.3 Heparin Dosing Weight: 50 kg  Vital Signs: Temp: 100.2 F (37.9 C) (11/25 1124) Temp Source: Bladder (11/25 0400) BP: 124/77 (11/25 1000) Pulse Rate: 107 (11/25 1000)  Labs: Recent Labs    09/09/2019 0215  09/01/2019 0412 08/29/2019 0623 08/19/2019 1033 09/14/2019 1224 09/04/2019 2256 09/09/19 0637 09/09/19 1046  HGB 14.4   < > 11.6* 13.3  --   --   --  11.4*  --   HCT 45.2   < > 34.0* 41.5  --   --   --  34.9*  --   PLT 184  --   --  205  --   --   --  197  --   APTT 26  --   --   --   --   --   --   --   --   LABPROT 16.7*  --   --   --   --   --   --   --   --   INR 1.4*  --   --   --   --   --   --   --   --   HEPARINUNFRC  --   --   --   --   --  <0.10* <0.10*  --  0.19*  CREATININE 3.71*  --   --  3.75*  --   --   --  3.24*  --   TROPONINIHS 1,431*  --   --  5,292* 6,454* 7,040*  --   --   --    < > = values in this interval not displayed.    Estimated Creatinine Clearance: 14.5 mL/min (A) (by C-G formula based on SCr of 3.24 mg/dL (H)).    Assessment: 78 y.o. male admitted with shortness of breath s/p recent PPM 11/16.  Pharmacy consulted to dose IV heparin for possible PE and ACS.  Troponin increased to 7040 and Doppler showed an age indeterminate DVT.  Heparin level is sub-therapeutic but trending up.  No issue with infusion nor bleeding per RN.  Goal of Therapy:  Heparin level 0.3-0.7 units/ml Monitor platelets by anticoagulation protocol: Yes   Plan:  No bolus per MD Increase heparin gtt to 1300 units/hr Check 8 hr heparin level  English Craighead D. Mina Marble, PharmD, BCPS, [place] 09/09/2019, 12:18 PM

## 2019-09-09 NOTE — Progress Notes (Signed)
ANTICOAGULATION CONSULT NOTE  Pharmacy Consult for Heparin Indication: Rule out PE / ACS  No Known Allergies  Patient Measurements: Height: 5\' 11"  (180.3 cm) Weight: 115 lb 4.8 oz (52.3 kg) IBW/kg (Calculated) : 75.3 Heparin Dosing Weight: 50 kg  Vital Signs: Temp: 99.7 F (37.6 C) (11/24 2000) Temp Source: Bladder (11/24 2000) BP: 88/47 (11/24 2359) Pulse Rate: 85 (11/24 2359)  Labs: Recent Labs    08/26/2019 0215 09/12/2019 0216 08/28/2019 0412 08/19/2019 0623 09/04/2019 1033 09/14/2019 1224 09/13/2019 2256  HGB 14.4 13.3 11.6* 13.3  --   --   --   HCT 45.2 39.0 34.0* 41.5  --   --   --   PLT 184  --   --  205  --   --   --   APTT 26  --   --   --   --   --   --   LABPROT 16.7*  --   --   --   --   --   --   INR 1.4*  --   --   --   --   --   --   HEPARINUNFRC  --   --   --   --   --  <0.10* <0.10*  CREATININE 3.71*  --   --  3.75*  --   --   --   TROPONINIHS 1,431*  --   --  5,292* 6,454* 7,040*  --     Estimated Creatinine Clearance: 11.8 mL/min (A) (by C-G formula based on SCr of 3.75 mg/dL (H)).    Assessment: 78 y.o. male admitted with shortness of breath s/p recent PPM 11/16.  Pharmacy consulted to dose IV heparin for possible PE and ACS.  Troponin increased to 7040 and Doppler showed an age indeterminate DVT.  Heparin level is undetectable but is 2 hours early.  No issue with heparin infusion per RN.  No bleeding reported.  11/25 AM update:  Heparin level low No issues per RN  Goal of Therapy:  Heparin level 0.3-0.7 units/ml Monitor platelets by anticoagulation protocol: Yes   Plan:  No bolus per MD Increase heparin drip to 1100 units/hr Check 8 hr heparin level  Narda Bonds, PharmD, BCPS Clinical Pharmacist Phone: [phone]

## 2019-09-09 NOTE — Progress Notes (Signed)
ANTICOAGULATION CONSULT NOTE  Pharmacy Consult for Heparin Indication: Rule out PE / ACS  No Known Allergies  Patient Measurements: Height: 5\' 11"  (180.3 cm) Weight: 121 lb 14.6 oz (55.3 kg) IBW/kg (Calculated) : 75.3 Heparin Dosing Weight: 50 kg  Vital Signs: Temp: 100.8 F (38.2 C) (11/25 2100) Temp Source: Bladder (11/25 2000) BP: 103/50 (11/25 2100) Pulse Rate: 94 (11/25 2100)  Labs: Recent Labs    10-02-19 0215  10/02/2019 0412 10/02/2019 0623 10/02/2019 1033  10-02-2019 1224 2019-10-02 2256 09/09/19 0637 09/09/19 1046 09/09/19 2055  HGB 14.4   < > 11.6* 13.3  --   --   --   --  11.4*  --   --   HCT 45.2   < > 34.0* 41.5  --   --   --   --  34.9*  --   --   PLT 184  --   --  205  --   --   --   --  197  --   --   APTT 26  --   --   --   --   --   --   --   --   --   --   LABPROT 16.7*  --   --   --   --   --   --   --   --   --   --   INR 1.4*  --   --   --   --   --   --   --   --   --   --   HEPARINUNFRC  --   --   --   --   --    < > <0.10* <0.10*  --  0.19* 0.42  CREATININE 3.71*  --   --  3.75*  --   --   --   --  3.24*  --   --   TROPONINIHS 1,431*  --   --  5,292* 6,454*  --  7,040*  --   --   --   --    < > = values in this interval not displayed.    Estimated Creatinine Clearance: 14.5 mL/min (A) (by C-G formula based on SCr of 3.24 mg/dL (H)).   Assessment: 78 y.o. male admitted with shortness of breath s/p recent PPM 11/16.  Pharmacy consulted to dose IV heparin for possible PE and ACS.  Troponin increased to 7040 and Doppler showed an age indeterminate DVT.  Heparin level is at goal tonight.  No issue with infusion nor bleeding noted.  Goal of Therapy:  Heparin level 0.3-0.7 units/ml Monitor platelets by anticoagulation protocol: Yes   Plan:  Continue heparin gtt at 1300 units/hr Heparin level in am  Thuy D. Mina Marble, PharmD, BCPS, [place] 09/09/2019, 9:31 PM

## 2019-09-09 NOTE — Progress Notes (Signed)
Pharmacy Antibiotic Note  Kenneth Matthews is a 78 y.o. male admitted on 09/04/2019 with SOB and hypoxic respiratory failure requiring intubation. Pharmacy has been consulted to transition patient from vancomycin and cefepime to Unasyn.   Renal function is starting to improve - CrCL 15 ml/min, now afebrile, WBC down 16, LA 2.3, PCT 1.44.  Plan: Unasyn 1.5gm IV Q12H Monitor renal fxn, clinical progress  Height: 5\' 11"  (180.3 cm) Weight: 121 lb 14.6 oz (55.3 kg) IBW/kg (Calculated) : 75.3  Temp (24hrs), Avg:99.2 F (37.3 C), Min:98.8 F (37.1 C), Max:99.7 F (37.6 C)  Recent Labs  Lab 09/03/19 0500 08/31/2019 0215 08/22/2019 0623 09/09/19 0637  WBC 7.8 17.3* 18.5* 16.0*  CREATININE 1.60* 3.71* 3.75* 3.24*  LATICACIDVEN  --  4.1* 2.3*  --   VANCORANDOM  --   --   --  10    Estimated Creatinine Clearance: 14.5 mL/min (A) (by C-G formula based on SCr of 3.24 mg/dL (H)).    No Known Allergies   Cefepime 11/24 >> 11/25 Vanc 11/24 >> 11/25 Unasyn 11/25 >>  11/25 VR = 10 mcg/mL (post vanc 1gm 11/24 at 0300 - SCr down 3.24)  11/24 MRSA PCR - positive 11/24 covid - negative 11/24 BCx -  11/24 UCx - negative 11/24 TA - GVR on Gram stian 11/24 Strep pneumo Ag - negative  [name], PharmD, BCPS, [place] 09/09/2019, 9:55 AM

## 2019-09-09 NOTE — Progress Notes (Signed)
eLink Physician-Brief Progress Note Patient Name: Kenneth Matthews DOB: 08/25/1941 MRN: [id number]   Date of Service  09/09/2019  HPI/Events of Note  Pt tonguing the ETT. Precedex is not providing adequate sedation due to hypotension limiting titration.  eICU Interventions  Discontinue Precedex, titrate Fentanyl up with target of RAS -1, PRN phenylephrine infusion for hypotension.        Kerry Kass Harlis Champoux 09/09/2019, 12:25 AM

## 2019-09-09 NOTE — Plan of Care (Addendum)
PCCM Brief Communication and Plan of Care   I spoke at the bedside with the patient's 2 sons regarding plan of care. We discussed the patient's respiratory failure and potential considerations (Likely aspiration PNA/pneumonitis, Underlying COPD, Possible atelectasis given recent procedure and relative debility, possible PE in setting of procedure & decreased mobility with R DVT of undetermined age) and current treatments. We discussed the patient's desire for no mechanical ventilation or enteral support. The sons express desire to 1-way extubate the patient tomorrow 08/17/2019 with understanding that some of the patient's existing chronic conditions may cause ongoing respiratory symptoms. I recommended palliative to see, which both sons agree to and appreciate. We discussed that it is possible that the patient may decline quickly following extubation, prompting consideration for true comfort care, but it is also possible that the patient will breathe sufficiently upon extubation and will be appropriately managed with palliative care.  We discussed the plan with the patient (off sedation, following commands at time of conversation). He endorses desire for 1-way extubation, DNR/DNI, no PEG placement, and follow up with palliative care.    Plan -consult palliative care -plan for 1-way extubation 11/26, upon family arrival (approximately 10am)   Time spent: 40 minutes  Eliseo Gum MSN, AGACNP-BC [place] [phone] If no answer, [phone] 09/09/2019, 12:23 PM

## 2019-09-09 NOTE — Progress Notes (Signed)
NAME:  Olin PiaRobert G Alatorre, MRN:  [id number], DOB:  04/04/1941, LOS: 1 ADMISSION DATE:  09/09/2019, CONSULTATION DATE:  09/07/2019 REFERRING MD:  Dr. 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   Past Medical History  HTN, HLD, COPD, BPH  Significant Hospital Events   11/13 to 11/19 hospitalized/ pacemaker placed 11/24 Admitted   Consults:    Procedures:  11/24 ETT >>  Significant Diagnostic Tests:   11/24 BLE US: R DVT of indeterminate age  27/24 ECHO: LVEF 70-75%. Hyperdynamic LV. RV normal systolic function.   11/25 CXR> bilateral interstitial opacities, ASD Micro Data:  11/24 BCx 2 >> 11/24 trach asp > abundant WBC, few gram variable rod  11/24 SARS CoV2 >> neg 11/24 MRSA> pos 11/24 UC > no growth 11/24 RVP > neg 11/24 urine strep > neg 11/24 urine legionella >>   Antimicrobials:  11/24 vanc > 11/25 11/24 cefepime > 11/25 11/25 Unasyn   Interim history/subjective:  Weaning sedation Following commands  Weaning Neo   Attempted PSV/CPAP - some tachypnea but appropriate minute ventilation. Resumed PRVC  Objective   Blood pressure 108/70, pulse 95, temperature 99.5 F (37.5 C), resp. rate 20, height 5\' 11"  (1.803 m), weight 55.3 kg, SpO2 100 %.    Vent Mode: PRVC FiO2 (%):  [30 %] 30 % Set Rate:  [20 bmp] 20 bmp Vt Set:  [600 mL] 600 mL PEEP:  [5 cmH20] 5 cmH20 Plateau Pressure:  [14 cmH20-20 cmH20] 20 cmH20   Intake/Output Summary (Last 24 hours) at 09/09/2019 0919 Last data filed at 09/09/2019 0800 Gross per 24 hour  Intake 2311.42 ml  Output 700 ml  Net 1611.42 ml   Filed Weights   09/01/2019 0600 09/09/19 0358  Weight: 52.3 kg 55.3 kg    Examination: General: Frail appearing older adult M, intubated and lightly sedated  NAD  Neuro:  Awakens to voice, following commands.  HEENT:  NCAT Pink mm. ETT secure. Trachea midline Cardiovascular:  RRR s1s2 no rgm, cap refill < 3 seconds  Lungs:  Coarse breath sounds. Shallow respirations, no accessory muscle use on PSV/CPAP Abdomen:  Soft, flat, ndnt.  Musculoskeletal:  No obvious joint deformity. Symmetrical bulk and tone. No cyanosis no clubbing  Skin:  Pale, clean, dry, intact without rash    Resolved Hospital Problem list    Assessment & Plan:   Acute hypoxic respiratory failure requiring intubation: Suspected aspiration: vs HCAP; less likely PE but still in differential. He has known dysphagia and failed MBS on 11/17, with recs for NPO.  PMT consulted where patient felt his quality of life depended on his ability to eat and did not want any artifical means of nutrition or feeding tube at that time and was adamant to go home knowing high risk of complications and aspiration risk.  - Per family discussions 11/24, ok with short-term intubation -Continue MV, qAM SBT/WUA  - Continue goals of care conversations-- I anticipate that aspiration events are likely to recur when patient is extubated, and thus respiratory failure is likely to recur. In this setting, repeated intubations are not likely to be of long-term benefit if underlying problem cannot be improved.   COPD -without acute exacerbation. - Continue BDs   ? PE: Hypoxia and dyspnea of very acute on set  with recent hospitalization. Wells score indeterminate, BNP and hs trop elevated (but were on last admit as well).  -RLE DVT of indeterminate age   78 - Ok to continue heparin gtt while intubated with anticipated bridge to other formulary when extubated    Sepsis: in the setting of pneumonia likely although other cultures pending. Recent PPM site looks good.  P - Most likely this is aspiration PNA. Will narrow abx to Unasyn.  -Must continue to follow Micro data as some data still pending-- escalate if  indicated   AKI - prior hx of chronic bladder outlet obstruction -Cr is slowly down-trending P:  - Continue foley, strict I/O -trend renal indices   Troponinemia  - peaked at 7040 - currently AV paced Hx complete AV block s/p pacemaker placement 08/31/2019 P:  -Heparin as above  -Cards consult when stabilized for further recs  Hx HTN, HLD P - Hold home norvasc, lopressor and lipitor  Protein calorie malnutrition Dysphagia -failed swallow study, PCM has seen.  eating increased patients QOL greatly so was continued with known risks P:  -Conflicting messages regarding cortrak and PEG placement -Current understanding is that patient would accept cortrak but not PEG.  -Follow up with family when arrive 11/25  -Ongoing GOC conversations in setting of dysphagia, continued PO intake   Goals of Care Per PCCM note 11/24: "Family is very concerned about his suffering and quality of life. They understand that while his acute conditions are treatable, his dysphagia and aspiration are likely not. Nadine Counts is clear there is to be no escalation of care. DNR if he should arrest. No pressors/lines. Should he decline, Nadine Counts would want to pursue comfort measures. Limited Code or DNR with short term and Mechanical ventilation"  Best practice:  Diet: NPO Pain/Anxiety/Delirium protocol (if indicated): RASS goal 0 to -1. Fentanyl, PRN midaz  VAP protocol (if indicated): yes DVT prophylaxis: heparin gtt GI prophylaxis: PPI Glucose control: monitor Mobility: BR Code Status: Limited (No CPR, no CV; short-term intubation ok) as per [facility] documentation 11/24. Continue GOC conversations: patient has 2 sons, significant other of 35 years per prior palliative notes  Family Communication: Pending 11/25  Disposition: Remains in ICU  Labs   CBC: Recent Labs  Lab 09/03/19 0500 09/13/2019 0215 09/09/2019 0216 08/29/2019 0412 09/06/2019 0623 09/09/19 0637  WBC 7.8 17.3*  --   --  18.5* 16.0*  NEUTROABS  --  16.0*   --   --   --   --   HGB 13.1 14.4 13.3 11.6* 13.3 11.4*  HCT 40.3 45.2 39.0 34.0* 41.5 34.9*  MCV 95.0 96.6  --   --  95.4 94.1  PLT 205 184  --   --  205 197    Basic Metabolic Panel: Recent Labs  Lab 09/03/19 0500 09/07/2019 0215 08/16/2019 0216 08/24/2019 0412 09/14/2019 0623 08/22/2019 1033 09/05/2019 1630 09/09/19 0637  NA 150* 142 140 139 140  --   --  143  K 4.0 5.0 5.2* 4.3 4.8  --   --  4.4  CL 118* 112*  --   --  109  --   --  113*  CO2 22 15*  --   --  18*  --   --  16*  GLUCOSE 121* 152*  --   --  142*  --   --  128*  BUN 45* 87*  --   --  86*  --   --  95*  CREATININE 1.60* 3.71*  --   --  3.75*  --   --  3.24*  CALCIUM 8.6* 8.7*  --   --  8.5*  --   --  8.1*  MG  --   --   --   --  1.8 1.6* 1.9 2.1  PHOS  --   --   --   --  3.6 3.9 3.7 3.7   GFR: Estimated Creatinine Clearance: 14.5 mL/min (A) (by C-G formula based on SCr of 3.24 mg/dL (H)). Recent Labs  Lab 09/03/19 0500 09/12/19 0215 2019/09/12 0623 09/09/19 0637  PROCALCITON  --   --  1.44  --   WBC 7.8 17.3* 18.5* 16.0*  LATICACIDVEN  --  4.1* 2.3*  --     Liver Function Tests: Recent Labs  Lab 12-Sep-2019 0215  AST 43*  ALT 31  ALKPHOS 95  BILITOT 0.9  PROT 7.6  ALBUMIN 2.8*   No results for input(s): LIPASE, AMYLASE in the last 168 hours. No results for input(s): AMMONIA in the last 168 hours.  ABG    Component Value Date/Time   PHART 7.345 (L) 09/12/19 0412   PCO2ART 32.5 September 12, 2019 0412   PO2ART 384.0 (H) 09-12-19 0412   HCO3 17.5 (L) 09/12/2019 0412   TCO2 18 (L) 09-12-2019 0412   ACIDBASEDEF 7.0 (H) 09-12-2019 0412   O2SAT 100.0 Sep 12, 2019 0412     Coagulation Profile: Recent Labs  Lab 2019/09/12 0215  INR 1.4*    Cardiac Enzymes: No results for input(s): CKTOTAL, CKMB, CKMBINDEX, TROPONINI in the last 168 hours.  HbA1C: No results found for: HGBA1C  CBG: Recent Labs  Lab September 12, 2019 1522 2019/09/12 2001 09/09/19 0042 09/09/19 0353 09/09/19 0731  GLUCAP 130* 111* 131* 105*  107*     Critical care time: 35 minutes     Eliseo Gum MSN, AGACNP-BC [place] [phone] If no answer, [phone] 09/09/2019, 9:19 AM

## 2019-09-10 DIAGNOSIS — R0609 Other forms of dyspnea: Secondary | ICD-10-CM | POA: Diagnosis not present

## 2019-09-10 DIAGNOSIS — Z515 Encounter for palliative care: Secondary | ICD-10-CM | POA: Diagnosis not present

## 2019-09-10 DIAGNOSIS — Z66 Do not resuscitate: Secondary | ICD-10-CM

## 2019-09-10 DIAGNOSIS — J9601 Acute respiratory failure with hypoxia: Secondary | ICD-10-CM | POA: Diagnosis not present

## 2019-09-10 DIAGNOSIS — R0600 Dyspnea, unspecified: Secondary | ICD-10-CM

## 2019-09-10 DIAGNOSIS — Z950 Presence of cardiac pacemaker: Secondary | ICD-10-CM

## 2019-09-10 DIAGNOSIS — I442 Atrioventricular block, complete: Secondary | ICD-10-CM

## 2019-09-10 DIAGNOSIS — J189 Pneumonia, unspecified organism: Secondary | ICD-10-CM

## 2019-09-10 DIAGNOSIS — E43 Unspecified severe protein-calorie malnutrition: Secondary | ICD-10-CM

## 2019-09-10 LAB — BASIC METABOLIC PANEL
Anion gap: 12 (ref 5–15)
BUN: 102 mg/dL — ABNORMAL HIGH (ref 8–23)
CO2: 16 mmol/L — ABNORMAL LOW (ref 22–32)
Calcium: 8.2 mg/dL — ABNORMAL LOW (ref 8.9–10.3)
Chloride: 115 mmol/L — ABNORMAL HIGH (ref 98–111)
Creatinine, Ser: 3.28 mg/dL — ABNORMAL HIGH (ref 0.61–1.24)
GFR calc Af Amer: 20 mL/min — ABNORMAL LOW (ref >60)
GFR calc non Af Amer: 17 mL/min — ABNORMAL LOW (ref >60)
Glucose, Bld: 134 mg/dL — ABNORMAL HIGH (ref 70–99)
Potassium: 4.1 mmol/L (ref 3.5–5.1)
Sodium: 143 mmol/L (ref 135–145)

## 2019-09-10 LAB — CBC
HCT: 31.6 % — ABNORMAL LOW (ref 39.0–52.0)
Hemoglobin: 10.3 g/dL — ABNORMAL LOW (ref 13.0–17.0)
MCH: 30.6 pg (ref 26.0–34.0)
MCHC: 32.6 g/dL (ref 30.0–36.0)
MCV: 93.8 fL (ref 80.0–100.0)
Platelets: 221 10*3/uL (ref 150–400)
RBC: 3.37 MIL/uL — ABNORMAL LOW (ref 4.22–5.81)
RDW: 15.3 % (ref 11.5–15.5)
WBC: 12.5 10*3/uL — ABNORMAL HIGH (ref 4.0–10.5)
nRBC: 0 % (ref 0.0–0.2)

## 2019-09-10 LAB — GLUCOSE, CAPILLARY
Glucose-Capillary: 128 mg/dL — ABNORMAL HIGH (ref 70–99)
Glucose-Capillary: 126 mg/dL — ABNORMAL HIGH (ref 70–99)
Glucose-Capillary: 97 mg/dL (ref 70–99)

## 2019-09-10 LAB — CULTURE, RESPIRATORY W GRAM STAIN: Culture: NORMAL

## 2019-09-10 LAB — HEPARIN LEVEL (UNFRACTIONATED): Heparin Unfractionated: 0.44 IU/mL (ref 0.30–0.70)

## 2019-09-10 MED ORDER — DIPHENHYDRAMINE HCL 50 MG/ML IJ SOLN: 25.0000 mg | INTRAMUSCULAR | Status: DC | PRN

## 2019-09-10 MED ORDER — GLYCOPYRROLATE 0.2 MG/ML IJ SOLN
0.1000 mg | INTRAMUSCULAR | Status: DC | PRN
  Administered 2019-09-10: 0.1 mg via INTRAVENOUS
  Filled 2019-09-10: qty 1

## 2019-09-10 MED ORDER — DEXTROSE 5 % IV SOLN: INTRAVENOUS | Status: DC

## 2019-09-10 MED ORDER — MORPHINE 100MG IN NS 100ML (1MG/ML) PREMIX INFUSION
0.0000 mg/h | INTRAVENOUS | Status: DC
  Administered 2019-09-10: 5 mg/h via INTRAVENOUS
  Filled 2019-09-10: qty 100

## 2019-09-10 MED ORDER — MORPHINE BOLUS VIA INFUSION
5.0000 mg | INTRAVENOUS | Status: DC | PRN
  Filled 2019-09-10: qty 5

## 2019-09-10 MED ORDER — POLYVINYL ALCOHOL 1.4 % OP SOLN
1.0000 [drp] | Freq: Four times a day (QID) | OPHTHALMIC | Status: DC | PRN
  Filled 2019-09-10: qty 15

## 2019-09-10 MED ORDER — HYDROMORPHONE HCL 1 MG/ML IJ SOLN
0.5000 mg | INTRAMUSCULAR | Status: DC | PRN
  Administered 2019-09-10: 1 mg via INTRAVENOUS
  Filled 2019-09-10: qty 1

## 2019-09-13 LAB — CULTURE, BLOOD (ROUTINE X 2)
Culture: NO GROWTH
Culture: NO GROWTH
Special Requests: ADEQUATE

## 2019-09-13 LAB — LEGIONELLA PNEUMOPHILA SEROGP 1 UR AG: L. pneumophila Serogp 1 Ur Ag: POSITIVE — AB

## 2019-09-15 NOTE — Procedures (Signed)
Extubation Procedure Note  Patient Details:   Name: Kenneth Matthews DOB: April 08, 1941 MRN: [id number]   Airway Documentation:    Vent end date: September 19, 2019 Vent end time: 1027   Evaluation  O2 sats: stable throughout Complications: No apparent complications Patient did tolerate procedure well. Bilateral Breath Sounds: Clear, Diminished   Yes   Patient extubated per MD order. RN at bedside. Positive cuff leak noted. Patient was placed on 4L [name]. Family is at bedside. Vitals are stable.   Herbie Saxon Mylin Gignac 19-Sep-2019, 10:29 AM

## 2019-09-15 NOTE — Death Summary Note (Addendum)
DEATH SUMMARY   Patient Details  Name: Kenneth Matthews MRN: [id number] DOB: 1941-10-05  Admission/Discharge Information   Admit Date:  12-Sep-2019  Date of Death:    Time of Death:    Length of Stay: 2  Referring Physician: Neale Burly, MD   Reason(s) for Hospitalization  Healthcare-associated pneumonia [J18.9] Acute respiratory failure with hypoxia ([place]) [J96.01] AKI (acute kidney injury) ([place]) [N17.9] Septic shock ([place]) [A41.9, R65.21]   Diagnoses  Preliminary cause of death: Aspiration pneumonia ([place]) Secondary Diagnoses (including complications and co-morbidities):  Active Problems:   Acute respiratory failure with hypoxia ([facility])   Healthcare-associated pneumonia   Aspiration into airway   Palliative care by specialist   DNR (do not resuscitate)   Dyspnea Acute on Chronic Diastolic Heart Failure  Brief Hospital Course (including significant findings, care, treatment, and services provided and events leading to death)  Kenneth Matthews is a 78 y.o. 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  He had recurrent episodes of dysphagia and aspiration  Pneumonia, and had previously decided against a PEG tube.  We met with his sons as well as with palliative care and they determined that he would not desire such life sustaining measures.  He was extubated the morning of 11/26 and was quickly transitioned to comfort measures only.  Time of death was 16:16. His family was at bedside.  Pertinent Labs and Studies  Significant Diagnostic Studies Dg Chest 2 View  Result Date: 09/01/2019 CLINICAL DATA:  Pacemaker EXAM: CHEST - 2 VIEW COMPARISON:  Portable exam 0612 hours compared to 08/31/2019 FINDINGS: LEFT subclavian transvenous pacemaker with leads projecting at RIGHT atrium and RIGHT ventricle. Normal  heart size, mediastinal contours, and pulmonary vascularity. Atherosclerotic calcification aorta. Emphysematous changes with BILATERAL pulmonary infiltrates in the mid to lower lungs greater on LEFT, slightly improved since previous exam. Small bibasilar pleural effusions greater on RIGHT. Calcified pleural plaques again seen suggesting asbestos exposure. No pneumothorax. Bones demineralized. 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 Slightly improved pulmonary infiltrates with small bibasilar pleural effusions RIGHT greater than LEFT. Electronically Signed   By: Kenneth Matthews M.D.   On: 09/01/2019 08:01   Ct Head Wo Contrast  Result Date: 08/29/2019 CLINICAL DATA:  Initial evaluation for acute seizure like activity. EXAM: CT HEAD WITHOUT CONTRAST TECHNIQUE: Contiguous axial images were obtained from the base of the skull through the vertex without intravenous contrast. COMPARISON:  None available. FINDINGS: Brain: Moderately advanced age-related cerebral atrophy. Patchy and confluent hypodensity within the periventricular deep white matter both cerebral hemispheres most consistent with chronic small vessel ischemic disease, moderate in nature. Few scattered remote lacunar infarcts noted within the bilateral basal ganglia and cerebellum. No acute intracranial hemorrhage. No acute large vessel territory infarct. No mass lesion, midline shift or mass effect. No hydrocephalus. No extra-axial fluid collection. Vascular: No hyperdense vessel. Calcified atherosclerosis at the skull base. Skull: Scalp soft tissues and calvarium within normal limits. Sinuses/Orbits: Globes and orbital soft tissues demonstrate no acute finding. Paranasal sinuses and mastoid air cells are clear. Other: None. IMPRESSION: 1. No acute intracranial abnormality. 2. Moderately advanced age-related cerebral atrophy with chronic small vessel ischemic disease. Electronically Signed   By: Kenneth Matthews M.D.   On: 08/29/2019  14:12   US Renal  Result Date: 08/30/2019 CLINICAL DATA:  Initial evaluation for  acute renal failure. EXAM: RENAL / URINARY TRACT ULTRASOUND COMPLETE COMPARISON:  None. FINDINGS: Right Kidney: Renal measurements: 10.0 x 3.7 x 4.5 cm = volume: 85.4 mL. Increased echogenicity within the renal parenchyma, compatible with medical renal disease. No nephrolithiasis or hydronephrosis. 1.5 x 1.1 x 1.2 cm simple cyst present at the lower pole. Additional 0.9 x 0.7 x 0.8 cm exophytic simple cyst present at the interpolar region. Left Kidney: Renal measurements: 9.1 x 4.8 x 5.0 cm = volume: 112.5 mL. Increased echogenicity within the renal parenchyma, compatible with medical renal disease. No nephrolithiasis or hydronephrosis. 3.2 x 2.6 x 3.3 cm simple cyst present at the mid-upper pole. Bladder: Mild bladder wall thickening likely related incomplete distension and/or chronic outlet obstruction. Bilateral ureteral jets are visualized. Other: Enlarged and somewhat nodular prostate with volume of 36.9 cc noted. IMPRESSION: 1. Increased echogenicity within the renal parenchyma, compatible with medical renal disease. No hydronephrosis. 2. Enlarged prostate. Mild circumferential bladder wall thickening likely related to incomplete distension and/or chronic outlet obstruction. 3. Simple bilateral renal cysts as above. Electronically Signed   By: Kenneth Matthews M.D.   On: 08/30/2019 14:26   Dg Chest Port 1 View  Result Date: 09/09/2019 CLINICAL DATA:  Between respiratory failure EXAM: PORTABLE CHEST 1 VIEW COMPARISON:  Radiograph 08/31/2019 FINDINGS: Endotracheal tube remains in the mid trachea, 6.7 cm from the carina. Transesophageal tube tip and side port distal to the GE junction. Pacer pack overlies the left chest wall with leads at the cardiac apex and right atrium. Persistent bilateral interstitial and airspace opacities with some loculated fluid in the right minor fissure. Suspect small basilar effusions as  well. Some calcified pleural plaque is noted. IMPRESSION: 1. Lines and tubes in stable, satisfactory position. 2. Persistent bilateral interstitial and airspace opacities with some loculated fluid in the right minor fissure and bilateral effusions. Electronically Signed   By: Lovena Le M.D.   On: 09/09/2019 05:53   Dg Chest Portable 1 View  Result Date: 08/23/2019 CLINICAL DATA:  Shortness of breath check endotracheal tube EXAM: PORTABLE CHEST 1 VIEW COMPARISON:  Film from earlier in the same day. FINDINGS: Endotracheal tube is noted 6.7 cm above the carina. Gastric catheter extends into the stomach. Stable pacing device is seen. Cardiac shadow is stable. Aortic calcifications are stable. Patchy infiltrate is noted with fluid trapped in the minor fissure on the right. Calcifications are noted suggestive of pleural plaques. IMPRESSION: Tubes and lines as described in satisfactory position. Bilateral infiltrates stable from the most recent exam. Electronically Signed   By: Inez Catalina M.D.   On: 08/24/2019 02:48   Dg Chest Portable 1 View  Result Date: 08/28/2019 CLINICAL DATA:  Shortness of breath EXAM: PORTABLE CHEST 1 VIEW COMPARISON:  09/01/2019 FINDINGS: Cardiac shadow is stable. Pacing device is again seen. Aortic calcifications are noted. Interval worsening of bilateral patchy infiltrates particularly on right with fluid within the minor fissures. No bony abnormality is noted. IMPRESSION: Worsening bilateral infiltrates particularly on the right. Electronically Signed   By: Inez Catalina M.D.   On: 08/28/2019 02:12   Dg Chest Port 1 View  Result Date: 08/31/2019 CLINICAL DATA:  Status post pacemaker implant EXAM: PORTABLE CHEST 1 VIEW COMPARISON:  08/29/2019 FINDINGS: Bilateral diffuse mild interstitial thickening. Bilateral calcified pleural plaques. Small bilateral pleural effusions. No pneumothorax. Heart and mediastinal contours are unremarkable. Dual lead cardiac pacemaker. There is no  acute osseous abnormality. IMPRESSION: Bilateral diffuse mild interstitial thickening and small bilateral pleural effusions concerning for  mild pulmonary edema. Electronically Signed   By: Kathreen Devoid   On: 08/31/2019 12:20   Dg Chest Port 1 View  Result Date: 08/29/2019 CLINICAL DATA:  Shortness of breath for 1 week EXAM: PORTABLE CHEST 1 VIEW COMPARISON:  None. FINDINGS: Cardiac shadow is at the upper limits of normal in size. Aortic calcifications are noted. Calcified pleural plaques are noted. Small right pleural effusion is seen. Right basilar atelectatic changes are noted. No bony abnormality is seen. IMPRESSION: Right basilar atelectasis with small effusion. Bilateral calcified pleural plaques. Electronically Signed   By: Inez Catalina M.D.   On: 08/29/2019 00:21   Dg Swallowing Func-speech Pathology  Result Date: 09/01/2019 Objective Swallowing Evaluation: Type of Study: MBS-Modified Barium Swallow Study  Patient Details Name: NAFTULA DONAHUE MRN: [id number] Date of Birth: 24-Mar-1941 Today's Date: 09/01/2019 Time: SLP Start Time (ACUTE ONLY): 1025 -SLP Stop Time (ACUTE ONLY): 1045 SLP Time Calculation (min) (ACUTE ONLY): 20 min Past Medical History: Past Medical History: Diagnosis Date  Hypertension  Past Surgical History: Past Surgical History: Procedure Laterality Date  PACEMAKER IMPLANT N/A 08/31/2019  Procedure: PACEMAKER IMPLANT;  Surgeon: Evans Lance, MD;  Location: [facility];  Service: Cardiovascular;  Laterality: N/A;  TEMPORARY PACEMAKER N/A 08/29/2019  Procedure: TEMPORARY PACEMAKER;  Surgeon: Burnell Blanks, MD;  Location: [facility];  Service: Cardiovascular;  Laterality: N/A; HPI: ARMANII PRESSNELL is a 78 y.o. male with medical history significant of hypertension on amlodipine and metoprolol presented with SOB.  EMS found him in complete AV block.Had elevated BNP and TP.  Subjective: pt says his swallowing difficulties worsened over the last few monts  Assessment / Plan / Recommendation CHL IP CLINICAL IMPRESSIONS 09/01/2019 Clinical Impression Pt has a severe oropharyngeal and cervical esophageal dysphagia that appears to have been progressing over time per pt, who says that he has had mild trouble for "years" but with an acute exacerbation over the last few months. He has reduced lingual propulsion with slow transit, oral residuals, and poor cohesion. He has trouble initiating a swallow without a larger bolus, but regardless of bolus size he has aspiration and poor pharyngeal clearance. He has little to no base of tongue retraction, hyolaryngeal excursion, epiglottic deflection, or pharyngeal squeeze. He repeatedly tries to swallow to clear severe, diffuse residuals with barium sitting in his laryngeal vestibule as he does, but such little movement means that only small amounts of barium enter the esophagus at a time. Postural maneuvers did not improve efficiency or safety. Pt still had moderate amounts of residue despite his best efforts to clear it, therefore more solid consistencies were deferred. Messaged MD about the severity of Mr. Wilkerson dysphagia with high risk for aspiration and inadequate nutrition/hydration. Also note that his dysphagia appears consistent with a neurologically-based dysphagia. 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 SLP will f/u for dysphagia intervention but pt will likely need alternative means of nutrition.  SLP Visit Diagnosis Dysphagia, pharyngoesophageal phase (R13.14);Dysphagia, oropharyngeal phase (R13.12) Attention and concentration deficit following -- Frontal lobe and executive function deficit following -- Impact on safety and function Severe aspiration risk;Risk for inadequate nutrition/hydration   CHL IP TREATMENT RECOMMENDATION 09/01/2019 Treatment Recommendations Therapy as  outlined in treatment plan below   Prognosis 09/01/2019 Prognosis for Safe Diet Advancement Fair Barriers to Reach Goals Severity of deficits;Time post onset Barriers/Prognosis Comment --  CHL IP DIET RECOMMENDATION 09/01/2019 SLP Diet Recommendations NPO;Ice chips PRN after oral care Liquid Administration via -- Medication Administration Via alternative means Compensations -- Postural Changes --   CHL IP OTHER RECOMMENDATIONS 09/01/2019 Recommended Consults -- Oral Care Recommendations Oral care QID Other Recommendations Have oral suction available   CHL IP FOLLOW UP RECOMMENDATIONS 09/01/2019 Follow up Recommendations (No Data)   CHL IP FREQUENCY AND DURATION 09/01/2019 Speech Therapy Frequency (ACUTE ONLY) min 2x/week Treatment Duration 2 weeks      CHL IP ORAL PHASE 09/01/2019 Oral Phase Impaired Oral - Pudding Teaspoon -- Oral - Pudding Cup -- Oral - Honey Teaspoon -- Oral - Honey Cup -- Oral - Nectar Teaspoon Reduced posterior propulsion;Lingual/palatal residue;Delayed oral transit;Decreased bolus cohesion Oral - Nectar Cup -- Oral - Nectar Straw -- Oral - Thin Teaspoon -- Oral - Thin Cup Reduced posterior propulsion;Lingual/palatal residue;Delayed oral transit;Decreased bolus cohesion Oral - Thin Straw -- Oral - Puree -- Oral - Mech Soft -- Oral - Regular -- Oral - Multi-Consistency -- Oral - Pill -- Oral Phase - Comment --  CHL IP PHARYNGEAL PHASE 09/01/2019 Pharyngeal Phase Impaired Pharyngeal- Pudding Teaspoon -- Pharyngeal -- Pharyngeal- Pudding Cup -- Pharyngeal -- Pharyngeal- Honey Teaspoon -- Pharyngeal -- Pharyngeal- Honey Cup -- Pharyngeal -- Pharyngeal- Nectar Teaspoon Reduced pharyngeal peristalsis;Reduced epiglottic inversion;Reduced anterior laryngeal mobility;Reduced airway/laryngeal closure;Reduced tongue base retraction;Penetration/Aspiration before swallow;Penetration/Aspiration during swallow;Pharyngeal residue - valleculae;Pharyngeal residue - pyriform Pharyngeal Material enters airway,  passes BELOW cords and not ejected out despite cough attempt by patient Pharyngeal- Nectar Cup -- Pharyngeal -- Pharyngeal- Nectar Straw -- Pharyngeal -- Pharyngeal- Thin Teaspoon -- Pharyngeal -- Pharyngeal- Thin Cup Reduced pharyngeal peristalsis;Reduced epiglottic inversion;Reduced anterior laryngeal mobility;Reduced airway/laryngeal closure;Reduced tongue base retraction;Penetration/Aspiration before swallow;Penetration/Aspiration during swallow;Pharyngeal residue - valleculae;Pharyngeal residue - pyriform Pharyngeal Material enters airway, passes BELOW cords and not ejected out despite cough attempt by patient Pharyngeal- Thin Straw -- Pharyngeal -- Pharyngeal- Puree -- Pharyngeal -- Pharyngeal- Mechanical Soft -- Pharyngeal -- Pharyngeal- Regular -- Pharyngeal -- Pharyngeal- Multi-consistency -- Pharyngeal -- Pharyngeal- Pill -- Pharyngeal -- Pharyngeal Comment --  CHL IP CERVICAL ESOPHAGEAL PHASE 09/01/2019 Cervical Esophageal Phase Impaired Pudding Teaspoon -- Pudding Cup -- Honey Teaspoon -- Honey Cup -- Nectar Teaspoon Reduced cricopharyngeal relaxation Nectar Cup -- Nectar Straw -- Thin Teaspoon -- Thin Cup Reduced cricopharyngeal relaxation Thin Straw -- Puree -- Mechanical Soft -- Regular -- Multi-consistency -- Pill -- Cervical Esophageal Comment -- Venita Sheffield Nix 09/01/2019, 1:00 PM              Vas [place] Lower Extremity Venous (dvt)  Result Date: 09/01/2019  Lower Venous Study Indications: SOB. Other Indications: Sudden hypoxic resp failure, elevated BNP/trop. Performing Technologist: Antonieta Pert RDMS, RVT  Examination Guidelines: A complete evaluation includes B-mode imaging, spectral Doppler, color Doppler, and power Doppler as needed of all accessible portions of each vessel. Bilateral testing is considered an integral part of a complete examination. Limited examinations for reoccurring indications may be performed as noted.   +---------+---------------+---------+-----------+------------+-----------------+ RIGHT    CompressibilityPhasicitySpontaneityProperties  Thrombus Aging    +---------+---------------+---------+-----------+------------+-----------------+ CFV      Partial        Yes      Yes        softly      Age Indeterminate  echogenic                     +---------+---------------+---------+-----------+------------+-----------------+ SFJ      Full                                                             +---------+---------------+---------+-----------+------------+-----------------+ FV Prox  Full                                                             +---------+---------------+---------+-----------+------------+-----------------+ FV Mid   Full                                                             +---------+---------------+---------+-----------+------------+-----------------+ FV DistalFull                                                             +---------+---------------+---------+-----------+------------+-----------------+ PFV      Full                                                             +---------+---------------+---------+-----------+------------+-----------------+ POP      Full           Yes      Yes                                      +---------+---------------+---------+-----------+------------+-----------------+ PTV      Full                                                             +---------+---------------+---------+-----------+------------+-----------------+ PERO     Full                                                             +---------+---------------+---------+-----------+------------+-----------------+ GSV      Full                                                             +---------+---------------+---------+-----------+------------+-----------------+  Iliac    Full           Yes      Yes                                      +---------+---------------+---------+-----------+------------+-----------------+ IVC                     Yes      Yes                                      +---------+---------------+---------+-----------+------------+-----------------+   Right Technical Findings: Atherosclerotic changes noted throughout.  +---------+---------------+---------+-----------+----------+--------------+ LEFT     CompressibilityPhasicitySpontaneityPropertiesThrombus Aging +---------+---------------+---------+-----------+----------+--------------+ CFV      Full           Yes      Yes                                 +---------+---------------+---------+-----------+----------+--------------+ SFJ      Full                                                        +---------+---------------+---------+-----------+----------+--------------+ FV Prox  Full                                                        +---------+---------------+---------+-----------+----------+--------------+ FV Mid   Full                                                        +---------+---------------+---------+-----------+----------+--------------+ FV DistalFull                                                        +---------+---------------+---------+-----------+----------+--------------+ PFV      Full                                                        +---------+---------------+---------+-----------+----------+--------------+ POP      Full           Yes      Yes                                 +---------+---------------+---------+-----------+----------+--------------+ PTV      Full                                                        +---------+---------------+---------+-----------+----------+--------------+  PERO     Full                                                         +---------+---------------+---------+-----------+----------+--------------+ GSV      Full                                                        +---------+---------------+---------+-----------+----------+--------------+   Left Technical Findings: Atherosclerotic changes noted throughout.   Summary: Right: Findings consistent with age indeterminate deep vein thrombosis involving the right common femoral vein. No cystic structure found in the popliteal fossa. All other veins visualized appear fully compressible and demonstrate appropriate Doppler characteristics. Left: There is no evidence of deep vein thrombosis in the lower extremity. No cystic structure found in the popliteal fossa.  *See table(s) above for measurements and observations. Electronically signed by Deitra Mayo MD on 08/22/2019 at 4:34:18 PM.    Final     Microbiology Recent Results (from the past 240 hour(s))  SARS Coronavirus 2 by RT PCR (hospital order, performed in [facility] hospital lab) Nasopharyngeal Nasopharyngeal Swab     Status: None   Collection Time: 09/04/2019  2:50 AM   Specimen: Nasopharyngeal Swab  Result Value Ref Range Status   SARS Coronavirus 2 NEGATIVE NEGATIVE Final    Comment: (NOTE) If result is NEGATIVE SARS-CoV-2 target nucleic acids are NOT DETECTED. The SARS-CoV-2 RNA is generally detectable in upper and lower  respiratory specimens during the acute phase of infection. The lowest  concentration of SARS-CoV-2 viral copies this assay can detect is 250  copies / mL. A negative result does not preclude SARS-CoV-2 infection  and should not be used as the sole basis for treatment or other  patient management decisions.  A negative result may occur with  improper specimen collection / handling, submission of specimen other  than nasopharyngeal swab, presence of viral mutation(s) within the  areas targeted by this assay, and inadequate number of viral copies  (<250 copies / mL). A negative  result must be combined with clinical  observations, patient history, and epidemiological information. If result is POSITIVE SARS-CoV-2 target nucleic acids are DETECTED. The SARS-CoV-2 RNA is generally detectable in upper and lower  respiratory specimens dur ing the acute phase of infection.  Positive  results are indicative of active infection with SARS-CoV-2.  Clinical  correlation with patient history and other diagnostic information is  necessary to determine patient infection status.  Positive results do  not rule out bacterial infection or co-infection with other viruses. If result is PRESUMPTIVE POSTIVE SARS-CoV-2 nucleic acids MAY BE PRESENT.   A presumptive positive result was obtained on the submitted specimen  and confirmed on repeat testing.  While 2019 novel coronavirus  (SARS-CoV-2) nucleic acids may be present in the submitted sample  additional confirmatory testing may be necessary for epidemiological  and / or clinical management purposes  to differentiate between  SARS-CoV-2 and other Sarbecovirus currently known to infect humans.  If clinically indicated additional testing with an alternate test  methodology [phone]) is advised. The SARS-CoV-2 RNA is generally  detectable in upper and lower respiratory sp ecimens during the  acute  phase of infection. The expected result is Negative. Fact Sheet for Patients:  StrictlyIdeas.no Fact Sheet for Healthcare Providers: BankingDealers.co.za This test is not yet approved or cleared by the [place] FDA and has been authorized for detection and/or diagnosis of SARS-CoV-2 by FDA under an Emergency Use Authorization (EUA).  This EUA will remain in effect (meaning this test can be used) for the duration of the COVID-19 declaration under Section 564(b)(1) of the Act, 21 U.S.C. section 360bbb-3(b)(1), unless the authorization is terminated or revoked sooner. Performed at [facility], [place] [address], [place], [place] [address]   Blood culture (routine x 2)     Status: None (Preliminary result)   Collection Time: 09/12/2019  3:05 AM   Specimen: BLOOD LEFT HAND  Result Value Ref Range Status   Specimen Description BLOOD LEFT HAND  Final   Special Requests   Final    BOTTLES DRAWN AEROBIC ONLY Blood Culture results may not be optimal due to an inadequate volume of blood received in culture bottles   Culture   Final    NO GROWTH 2 DAYS Performed at [facility], [place] [address] [place], [place] [address]    Report Status PENDING  Incomplete  Urine culture     Status: None   Collection Time: 08/20/2019  5:03 AM   Specimen: In/Out Cath Urine  Result Value Ref Range Status   Specimen Description IN/OUT CATH URINE  Final   Special Requests NONE  Final   Culture   Final    NO GROWTH Performed at [facility], [place] [address] [place], [place] [address]    Report Status 09/09/2019 FINAL  Final  Blood culture (routine x 2)     Status: None (Preliminary result)   Collection Time: 09/11/2019  6:23 AM   Specimen: BLOOD LEFT ARM  Result Value Ref Range Status   Specimen Description BLOOD LEFT ARM  Final   Special Requests   Final    BOTTLES DRAWN AEROBIC AND ANAEROBIC Blood Culture adequate volume   Culture   Final    NO GROWTH 2 DAYS Performed at [facility], [place] [address], [place], [place] [address]    Report Status PENDING  Incomplete  Respiratory Panel by PCR     Status: None   Collection Time: 08/22/2019  6:54 AM   Specimen: Nasopharyngeal Swab; Respiratory  Result Value Ref Range Status   Adenovirus NOT DETECTED NOT DETECTED Final   Coronavirus 229E NOT DETECTED NOT DETECTED Final    Comment: (NOTE) The Coronavirus on the Respiratory Panel, DOES NOT test for the novel  Coronavirus (2019 nCoV)    Coronavirus HKU1 NOT DETECTED NOT DETECTED Final   Coronavirus NL63 NOT DETECTED NOT DETECTED Final   Coronavirus OC43 NOT DETECTED NOT  DETECTED Final   Metapneumovirus NOT DETECTED NOT DETECTED Final   Rhinovirus / Enterovirus NOT DETECTED NOT DETECTED Final   Influenza A NOT DETECTED NOT DETECTED Final   Influenza B NOT DETECTED NOT DETECTED Final   Parainfluenza Virus 1 NOT DETECTED NOT DETECTED Final   Parainfluenza Virus 2 NOT DETECTED NOT DETECTED Final   Parainfluenza Virus 3 NOT DETECTED NOT DETECTED Final   Parainfluenza Virus 4 NOT DETECTED NOT DETECTED Final   Respiratory Syncytial Virus NOT DETECTED NOT DETECTED Final   Bordetella pertussis NOT DETECTED NOT DETECTED Final   Chlamydophila pneumoniae NOT DETECTED NOT DETECTED Final   Mycoplasma pneumoniae NOT DETECTED NOT DETECTED Final  Comment: Performed at [facility], [place] [address] [place], [place] [address]  Culture, respiratory (non-expectorated)     Status: None   Collection Time: 08/19/2019  7:37 AM   Specimen: Tracheal Aspirate; Respiratory  Result Value Ref Range Status   Specimen Description TRACHEAL ASPIRATE  Final   Special Requests NONE  Final   Gram Stain   Final    ABUNDANT WBC PRESENT,BOTH PMN AND MONONUCLEAR FEW GRAM VARIABLE ROD    Culture   Final    Consistent with normal respiratory flora. Performed at [facility], [place] [address] [place], [place] [address]    Report Status 2019/09/20 FINAL  Final    Lab Basic Metabolic Panel: Recent Labs  Lab 08/19/2019 0215 08/21/2019 0216 09/02/2019 0412 08/24/2019 0623 09/07/2019 1033 09/14/2019 1630 09/09/19 0637 09/09/19 1647 2019-09-20 0043  NA 142 140 139 140  --   --  143  --  143  K 5.0 5.2* 4.3 4.8  --   --  4.4  --  4.1  CL 112*  --   --  109  --   --  113*  --  115*  CO2 15*  --   --  18*  --   --  16*  --  16*  GLUCOSE 152*  --   --  142*  --   --  128*  --  134*  BUN 87*  --   --  86*  --   --  95*  --  102*  CREATININE 3.71*  --   --  3.75*  --   --  3.24*  --  3.28*  CALCIUM 8.7*  --   --  8.5*  --   --  8.1*  --  8.2*  MG  --   --   --  1.8 1.6* 1.9 2.1 2.1  --    PHOS  --   --   --  3.6 3.9 3.7 3.7 3.8  --    Liver Function Tests: Recent Labs  Lab 09/09/2019 0215  AST 43*  ALT 31  ALKPHOS 95  BILITOT 0.9  PROT 7.6  ALBUMIN 2.8*   No results for input(s): LIPASE, AMYLASE in the last 168 hours. No results for input(s): AMMONIA in the last 168 hours. CBC: Recent Labs  Lab 08/24/2019 0215 08/27/2019 0216 09/14/2019 0412 09/05/2019 0623 09/09/19 0637 September 20, 2019 0043  WBC 17.3*  --   --  18.5* 16.0* 12.5*  NEUTROABS 16.0*  --   --   --   --   --   HGB 14.4 13.3 11.6* 13.3 11.4* 10.3*  HCT 45.2 39.0 34.0* 41.5 34.9* 31.6*  MCV 96.6  --   --  95.4 94.1 93.8  PLT 184  --   --  205 197 221   Cardiac Enzymes: No results for input(s): CKTOTAL, CKMB, CKMBINDEX, TROPONINI in the last 168 hours. Sepsis Labs: Recent Labs  Lab 08/16/2019 0215 08/25/2019 0623 09/09/19 0637 09-20-2019 0043  PROCALCITON  --  1.44  --   --   WBC 17.3* 18.5* 16.0* 12.5*  LATICACIDVEN 4.1* 2.3*  --   --     Spero Geralds 09-20-19, 4:22 PM

## 2019-09-15 NOTE — Progress Notes (Signed)
Called to see patient for unresponsiveness. On exam the patient did not respond to verbal or physical stimuli. Absent heart and breath sounds.  Absent peripheral pulses. Pupils fixed and dilated. Patient pronounced dead at 16:16.  Family present at bedside.  Autopsy declined.

## 2019-09-15 NOTE — Progress Notes (Signed)
ANTICOAGULATION CONSULT NOTE  Pharmacy Consult for Heparin Indication: Rule out PE / ACS  No Known Allergies  Patient Measurements: Height: 5\' 11"  (180.3 cm) Weight: 124 lb 9 oz (56.5 kg) IBW/kg (Calculated) : 75.3 Heparin Dosing Weight: 50 kg  Vital Signs: Temp: 100 F (37.8 C) (11/26 0800) Temp Source: Core (11/26 0800) BP: 130/67 (11/26 1000) Pulse Rate: 104 (11/26 1000)  Labs: Recent Labs    09/17/2019 0215  09/17/2019 0623 09-17-19 1033 09-17-19 1224  09/09/19 0637 09/09/19 1046 09/09/19 2055 09/03/2019 0043  HGB 14.4   < > 13.3  --   --   --  11.4*  --   --  10.3*  HCT 45.2   < > 41.5  --   --   --  34.9*  --   --  31.6*  PLT 184  --  205  --   --   --  197  --   --  221  APTT 26  --   --   --   --   --   --   --   --   --   LABPROT 16.7*  --   --   --   --   --   --   --   --   --   INR 1.4*  --   --   --   --   --   --   --   --   --   HEPARINUNFRC  --   --   --   --  <0.10*   < >  --  0.19* 0.42 0.44  CREATININE 3.71*  --  3.75*  --   --   --  3.24*  --   --  3.28*  TROPONINIHS 1,431*  --  5,292* 6,454* 7,040*  --   --   --   --   --    < > = values in this interval not displayed.    Estimated Creatinine Clearance: 14.6 mL/min (A) (by C-G formula based on SCr of 3.28 mg/dL (H)).  Assessment: 78 y.o. male admitted with shortness of breath s/p recent PPM 11/16.  Pharmacy consulted to dose IV heparin for possible PE and ACS.  Troponin increased to 7040 and Doppler showed an age indeterminate DVT.  Heparin level within goal  Plan for terminal extubation today  Goal of Therapy:  Heparin level 0.3-0.7 units/ml Monitor platelets by anticoagulation protocol: Yes   Plan:  No bolus per MD heparin 1300 units/hr Daily hep lvl cbc  Barth Kirks, PharmD, BCPS, BCCCP Clinical Pharmacist [phone]  Please check AMION for all [facility] numbers  08/23/2019 10:22 AM

## 2019-09-15 NOTE — Progress Notes (Addendum)
NAME:  Kenneth Matthews, MRN:  [id number], DOB:  1941/08/11, LOS: 2 ADMISSION DATE:  08/31/2019, CONSULTATION DATE:  08/18/2019 REFERRING MD:  Dr. Dina Rich, CHIEF COMPLAINT:  Respiratory faiure  Brief History   54 yoM with prior hx of recent pacemaker placement 11/16 for complete AV block and noted to have severe dysphagia with failed MBS presenting from home with SOB and acute respiratory hypoxic failure requiring intubation  Procedures:  11/24 ETT >>  Significant Diagnostic Tests:  11/24 BLE [place]: R DVT of indeterminate age  5/24 ECHO: LVEF 70-75%. Hyperdynamic LV. RV normal systolic function.   11/25 CXR> bilateral interstitial opacities, ASD  Micro Data:  11/24 BCx 2 >> 11/24 trach asp > abundant WBC, few gram variable rod  11/24 SARS CoV2 >> neg 11/24 MRSA> pos 11/24 UC > no growth 11/24 RVP > neg 11/24 urine strep > neg 11/24 urine legionella >>   Antimicrobials:  11/24 vanc > 11/25 11/24 cefepime > 11/25 11/25 Unasyn   Interim history/subjective:  No overnight issues. Weaning fentanyl to see if he can be more alert. On 30 mcg of neosynephrine running peripherally. Given a dose of doxycyline overnight.  Objective   Blood pressure (!) 98/58, pulse (!) 105, temperature 100 F (37.8 C), temperature source Core, resp. rate (!) 29, height _0  (1.803 m), weight 56.5 kg, SpO2 100 %.    Vent Mode: PRVC FiO2 (%):  [30 %] 30 % Set Rate:  [20 bmp] 20 bmp Vt Set:  [600 mL] 600 mL PEEP:  [5 cmH20] 5 cmH20 Pressure Support:  [5 cmH20] 5 cmH20 Plateau Pressure:  [17 cmH20-21 cmH20] 21 cmH20   Intake/Output Summary (Last 24 hours) at September 26, 2019 0849 Last data filed at 26-Sep-2019 0800 Gross per 24 hour  Intake 2981.91 ml  Output 810 ml  Net 2171.91 ml   Filed Weights   08/29/2019 0600 09/09/19 0358 09/26/19 0339  Weight: 52.3 kg 55.3 kg 56.5 kg    Examination: General: Intubated, resting comfortably, sitting up in bed. HENT: Endotracheal tube in place, no scleral  icterus Lungs: Diminished, clear, no wheezes or crackles, no rhonchi Cardiovascular: Tachycardic, regular there are harsh diastolic and systolic murmurs Abdomen: Scaphoid, soft, nondistended, normal bowel sounds Extremities: Thin, no edema Neuro: Awake, answers questions and follows commands. 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  He was tachypneic with spontaneous breathing trial this morning.  His family is at bedside, and the plan is for a one-way extubation.  We will make sure that he is comfortable with appropriate medications for work of breathing.  Referral to hospice may be appropriate pending his respiratory status post extubation.  For now, we will plan to continue the Unasyn for 5 more days.   ADDENDUM: Met with patient's 2 sons at bedside.  We proceeded with extubation.  After meeting with palliative care services they elected to make him comfort measures only.  These orders were updated and plan discussed with RN.   Best practice:  Diet: tube feeds were running until now Pain/Anxiety/Delirium protocol (if  indicated): on fentanyl VAP protocol (if indicated): yes DVT prophylaxis: heparin gtt GI prophylaxis: on PPI Glucose control: controlled.  Foley no Mobility: bed rest Code Status:DNR Family Communication: family coming in today Disposition: remains in ICU.    Labs   CBC: Recent Labs  Lab 08/21/2019 0215 09/05/2019 0216 08/24/2019 0412  08/27/2019 0623 09/09/19 0637 09-13-2019 0043  WBC 17.3*  --   --  18.5* 16.0* 12.5*  NEUTROABS 16.0*  --   --   --   --   --   HGB 14.4 13.3 11.6* 13.3 11.4* 10.3*  HCT 45.2 39.0 34.0* 41.5 34.9* 31.6*  MCV 96.6  --   --  95.4 94.1 93.8  PLT 184  --   --  205 197 790    Basic Metabolic Panel: Recent Labs  Lab 09/05/2019 0215 08/22/2019 0216 09/13/2019 0412 09/12/2019 0623 08/20/2019 1033 08/21/2019 1630 09/09/19 0637 09/09/19 1647 09/13/19 0043  NA 142 140 139 140  --   --  143  --  143  K 5.0 5.2* 4.3 4.8  --   --  4.4  --  4.1  CL 112*  --   --  109  --   --  113*  --  115*  CO2 15*  --   --  18*  --   --  16*  --  16*  GLUCOSE 152*  --   --  142*  --   --  128*  --  134*  BUN 87*  --   --  86*  --   --  95*  --  102*  CREATININE 3.71*  --   --  3.75*  --   --  3.24*  --  3.28*  CALCIUM 8.7*  --   --  8.5*  --   --  8.1*  --  8.2*  MG  --   --   --  1.8 1.6* 1.9 2.1 2.1  --   PHOS  --   --   --  3.6 3.9 3.7 3.7 3.8  --    GFR: Estimated Creatinine Clearance: 14.6 mL/min (A) (by C-G formula based on SCr of 3.28 mg/dL (H)). Recent Labs  Lab 09/02/2019 0215 09/12/2019 0623 09/09/19 0637 2019-09-13 0043  PROCALCITON  --  1.44  --   --   WBC 17.3* 18.5* 16.0* 12.5*  LATICACIDVEN 4.1* 2.3*  --   --     Liver Function Tests: Recent Labs  Lab 09/05/2019 0215  AST 43*  ALT 31  ALKPHOS 95  BILITOT 0.9  PROT 7.6  ALBUMIN 2.8*   No results for input(s): LIPASE, AMYLASE in the last 168 hours. No results for input(s): AMMONIA in the last 168 hours.  ABG    Component Value Date/Time   PHART 7.345 (L) 09/11/2019 0412   PCO2ART 32.5 08/29/2019 0412   PO2ART 384.0 (H) 09/06/2019 0412   HCO3 17.5 (L) 08/31/2019 0412   TCO2 18 (L) 08/29/2019 0412   ACIDBASEDEF 7.0 (H) 08/27/2019 0412   O2SAT 100.0 09/07/2019 0412     Coagulation Profile: Recent Labs  Lab 08/21/2019 0215  INR 1.4*    Cardiac Enzymes: No results for input(s): CKTOTAL, CKMB, CKMBINDEX, TROPONINI in the last 168  hours.  HbA1C: No results found for: HGBA1C  CBG: Recent Labs  Lab 09/09/19 1536 09/09/19 1945 09/09/19 2311 2019-09-13 0341 13-Sep-2019 0750  GLUCAP 126* 122* 146* 128* 126*    Critical care time:   The patient is critically  ill with multiple organ systems failure and requires high complexity decision making for assessment and support, frequent evaluation and titration of therapies, application of advanced monitoring technologies and extensive interpretation of multiple databases.   Critical Care Time devoted to patient care services described in this note is 42 minutes. This time reflects the time of my personal involvement. This critical care time does not reflect separately billable procedures or procedure time, teaching time or supervisory time of PA/NP/Med student/Med Resident etc but could involve care discussion time.  [facility] Oct 01, 2019 8:49 AM  Pager: [phone] After hours pager: [phone]

## 2019-09-15 NOTE — Progress Notes (Signed)
Chaplain spoke with Kenneth Matthews parish priest who said that he is unable to come to [place].  Chaplain will call a parish closer to see if someone can come in tonight or tomorrow to see Kenneth Matthews.   Chaplain will follow-up.

## 2019-09-15 NOTE — Consult Note (Signed)
Consultation Note Date: 10/03/19   Patient Name: Kenneth Matthews  DOB: 12/17/1940  MRN: [id number]  Age / Sex: 78 y.o., male  PCP: Neale Burly, MD Referring Physician: Spero Geralds, MD  Reason for Consultation: Establishing goals of care  HPI/Patient Profile: 78 y.o. male  admitted on 08/18/2019 with past medical  history of HTN, HLD, COPD, former smoker, BPH, complete heart block s/p pacemaker, and dysphagia presenting from home with sudden onset of shortness of breath found to be hypotensive and hypoxic.  Recent hospitalization 11/13 to 11/19.  COVID negative 11/14 with pacemaker placement on 11/16 for complete heart block.  There was concern for seizure like activity with Neurology consulting however felt that episodes were related to hypoperfusion during bradycardic episodes.   Also noted to AKI and dysphagia with failed MBS with high risk of aspiration   Was seen by palliative care medicine for goals of care discussion where patient verbalized that eating contributed to his quality of life, did not want any means of artifical feeding or tubes, and accepted the risk to continue eating.   Admitted through the emergency room on 10/08/2019 with worsening shortness of breath and acute decompensation.  Patient lives at home with his life partner Kenneth Matthews, they are not married.   Found to be febrile with soft blood pressure.  Initially required increasing amounts of oxygen with labored breathing.  CXR showing bilateral infiltrates, greater on right.  He was treated with 30 cc/kg bolus and started on empiric vancomycin and cefepime.  Given worsening respiratory distress and patient wishes to remain a full code, he was intubated.  Labs noted for WBC 17.3, BUN 87, sCr 3.71 (on 11/19 was 1.6), BNP 747, hs-trop 1431, EKG AV paced.  COVID test pending and remains on airborne and contact precautions.  PCCM called for  admit.   In conversation with the patient's 2 sons the patient did not want to be intubated and decision for liberation from the ventilator was made this morning.  Palliative medicine consulted to help family with advanced directive decisions, treatment option decisions and anticipatory care needs.    Clinical Assessment and Goals of Care:   This NP Wadie Lessen reviewed medical records, received report from team, assessed the patient and then meet at the patient's bedside along with his 2 sons [name] and [name] to discuss diagnosis, prognosis, GOC, EOL wishes disposition and options.  Concept of Hospice and Palliative Care were discussed  A detailed discussion was had today regarding advanced directives.  Concepts specific to code status, artifical feeding and hydration, continued IV antibiotics and rehospitalization was had.  The difference between a aggressive medical intervention path  and a palliative comfort care path for this patient at this time was had.  Values and goals of care important to patient and family were attempted to be elicited.  MOST form introduced and Hard Choices booklet left for review.  Natural trajectory and expectations at EOL were discussed.  Questions and concerns addressed.   Family encouraged to  call with questions or concerns.    After my conversation with the family they were to take some time today with their decision to shift to full comfort however after a short period of time they made their decision to shift to a full comfort path and expressed their wishes to the attending Dr. [name] and she shifted focus of care to comfort and implemented end-of-life comfort measures  PMT will continue to support holistically.  There is no documented healthcare power of attorney.  Patient's 2 sons/next of kin make decisions in unison for the father.      SUMMARY OF RECOMMENDATIONS    Code Status/Advance Care Planning:  DNR   Palliative Prophylaxis:    Aspiration, Bowel Regimen, Delirium Protocol, Frequent Pain Assessment and Oral Care  Additional Recommendations (Limitations, Scope, Preferences):  Full Comfort Care  Psycho-social/Spiritual:   Desire for further Chaplaincy support:yes  Additional Recommendations: Education on Hospice   Created space and opportunity for family to explore their thoughts and feelings regarding her father's current medical situation.  They speak to a man who is highly independent, private and dignified.  They "no" that this patient would not want to continue on this medical path and are making decision to shift to full comfort.  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 The following aspects are pertinent.  Past Medical History:  Diagnosis Date   Hypertension    Social History   Socioeconomic History   Marital status: Single    Spouse name: Not on file   Number of children: Not on file   Years of education: Not on file   Highest education level: Not on file  Occupational History   Not on file  Social Needs   Financial resource strain: Not on file   Food insecurity    Worry: Not on file    Inability: Not on file   Transportation needs    Medical: Not on file    Non-medical: Not on file  Tobacco Use   Smoking status: Former Smoker   Smokeless tobacco: Never Used  Substance and Sexual Activity   Alcohol use: Not on file   Drug use: Not on file   Sexual activity: Not on file  Lifestyle   Physical activity    Days per week: Not on file    Minutes per session: Not on file   Stress: Not on file  Relationships    Social connections    Talks on phone: Not on file    Gets together: Not on file    Attends religious service: Not on file    Active member of club or organization: Not on file    Attends meetings of clubs or organizations: Not on file    Relationship status: Not on file  Other Topics Concern   Not on file  Social History Narrative   Not on file   No family history on file. Scheduled Meds:  chlorhexidine gluconate (MEDLINE KIT)  15 mL Mouth Rinse BID   Chlorhexidine Gluconate Cloth  6 each Topical Daily   feeding supplement (VITAL AF 1.2 CAL)  1,000 mL Per Tube Q24H   ipratropium-albuterol  3 mL Nebulization Q4H   mouth rinse  15 mL Mouth Rinse 10 times per day   pantoprazole sodium  40 mg Per Tube Daily   Continuous Infusions:  sodium chloride Stopped (09-22-2019 0824)   ampicillin-sulbactam (UNASYN) IV Stopped (22-Sep-2019 0037)   fentaNYL infusion INTRAVENOUS 100 mcg/hr (2019/09/22 0900)   heparin 1,300 Units/hr (22-Sep-2019 0900)   phenylephrine (NEO-SYNEPHRINE) Adult infusion 30 mcg/min (09/22/19 0900)   sodium chloride Stopped (09/11/2019 0343)   PRN Meds:.sodium chloride, acetaminophen, albuterol, bisacodyl, docusate, midazolam Medications Prior to Admission:  Prior to Admission medications   Medication Sig Start Date End Date Taking? Authorizing Provider  albuterol (VENTOLIN HFA) 108 (90 Base) MCG/ACT inhaler Inhale 2 puffs into the lungs every 4 (four) hours as needed for wheezing or shortness of breath. 08/06/19   [provider]  amLODipine (NORVASC) 5 MG tablet Take 5 mg by mouth daily. 08/24/19   [provider]  atorvastatin (LIPITOR) 20 MG tablet Take 20 mg by mouth at bedtime. 07/06/19   [provider]  metoprolol tartrate (LOPRESSOR) 25 MG tablet Take 12.5 mg by mouth every morning. 07/13/19   [provider]  tamsulosin (FLOMAX) 0.4 MG CAPS capsule Take 0.4 mg by mouth daily. 08/24/19   [provider]   No Known  Allergies Review of Systems  Unable to perform ROS: Acuity of condition    Physical Exam Constitutional:      Appearance: He is cachectic. He is ill-appearing.     Interventions: Nasal cannula in place.  Cardiovascular:     Rate and Rhythm: Tachycardia present.  Skin:    General: Skin is warm and dry.  Neurological:     Mental Status: He is lethargic.     Vital Signs: BP (!) 122/55    Pulse (!) 107    Temp 100 F (37.8 C) (Core)    Resp (!) 22    Ht '5\' 11"'$  (1.803 m)    Wt 56.5 kg    SpO2 100%    BMI 17.37 kg/m  Pain Scale: CPOT   Pain Score: Asleep   SpO2: SpO2: 100 % O2 Device:SpO2: 100 % O2 Flow Rate: .O2 Flow Rate (L/min): 30 L/min  IO: Intake/output summary:   Intake/Output Summary (Last 24 hours) at 2019-09-22 0946 Last data filed at September 22, 2019 0900 Gross per 24 hour  Intake 3021.04 ml  Output 810 ml  Net 2211.04 ml    LBM: Last BM Date: (PTA) Baseline Weight: Weight: 52.3 kg Most recent weight: Weight: 56.5 kg     Palliative Assessment/Data:   Discussed with bedside RN and Dr [name]  Time In: 1245 Time Out: 1400 Time Total: 75 minutes Greater than 50%  of this time was spent counseling and coordinating care related to the above assessment and plan.  Signed by: Wadie Lessen, NP   Please contact Palliative Medicine Team phone at [phone] for questions and concerns.  For individual provider: See Shea Evans

## 2019-09-15 NOTE — Progress Notes (Signed)
Chaplain worked to obtain priest for family.  Chaplain left message with one [facility] on their emergency line.  Chaplain will follow-up with family if priest calls.  Family said they would decide what to do if they could not get in touch with a priest. Kenneth Matthews provided rosaries for Kenneth Matthews sons and offered spiritual support.  Chaplain will follow-up.

## 2019-09-15 NOTE — Progress Notes (Signed)
Patient asystolic on the monitor. No radial pulse palpated, 2 nurses, Arneta Mahmood D, RN and Solmon Ice F, RN auscultated for heart tones x 2 minutes. No heart tones heard. Pupils fixed and dilated. Patients 2 sons at bedside. Time of death 16:16.  Dr. [name] called and made aware of patients passing. Will continue to provide comfort and emotional support to patients family.

## 2019-09-15 DEATH — deceased

## 2019-09-17 ENCOUNTER — Ambulatory Visit: Payer: Medicare HMO

## 2019-09-21 ENCOUNTER — Telehealth: Payer: Self-pay

## 2019-09-21 NOTE — Telephone Encounter (Signed)
On 09/21/2019 Received from [facility].  Dc is for burial and a patient of Doctor [name].  DC will be taken to [facility] for signature.  On 09/24/2019 Received signed dc back from Doctor [name].  I called the funeral home to let them know the dc is ready for pickup.

## 2019-11-30 ENCOUNTER — Encounter: Payer: Medicare HMO | Admitting: Internal Medicine

## 2020-03-15 IMAGING — DX DG CHEST 1V PORT
2 series · 2 of 2 positions shown · non-contrast
Comparison: Radiograph 09/08/2019

CLINICAL DATA: Between respiratory failure

EXAM:
PORTABLE CHEST 1 VIEW

[chest ap (1 of 2)]
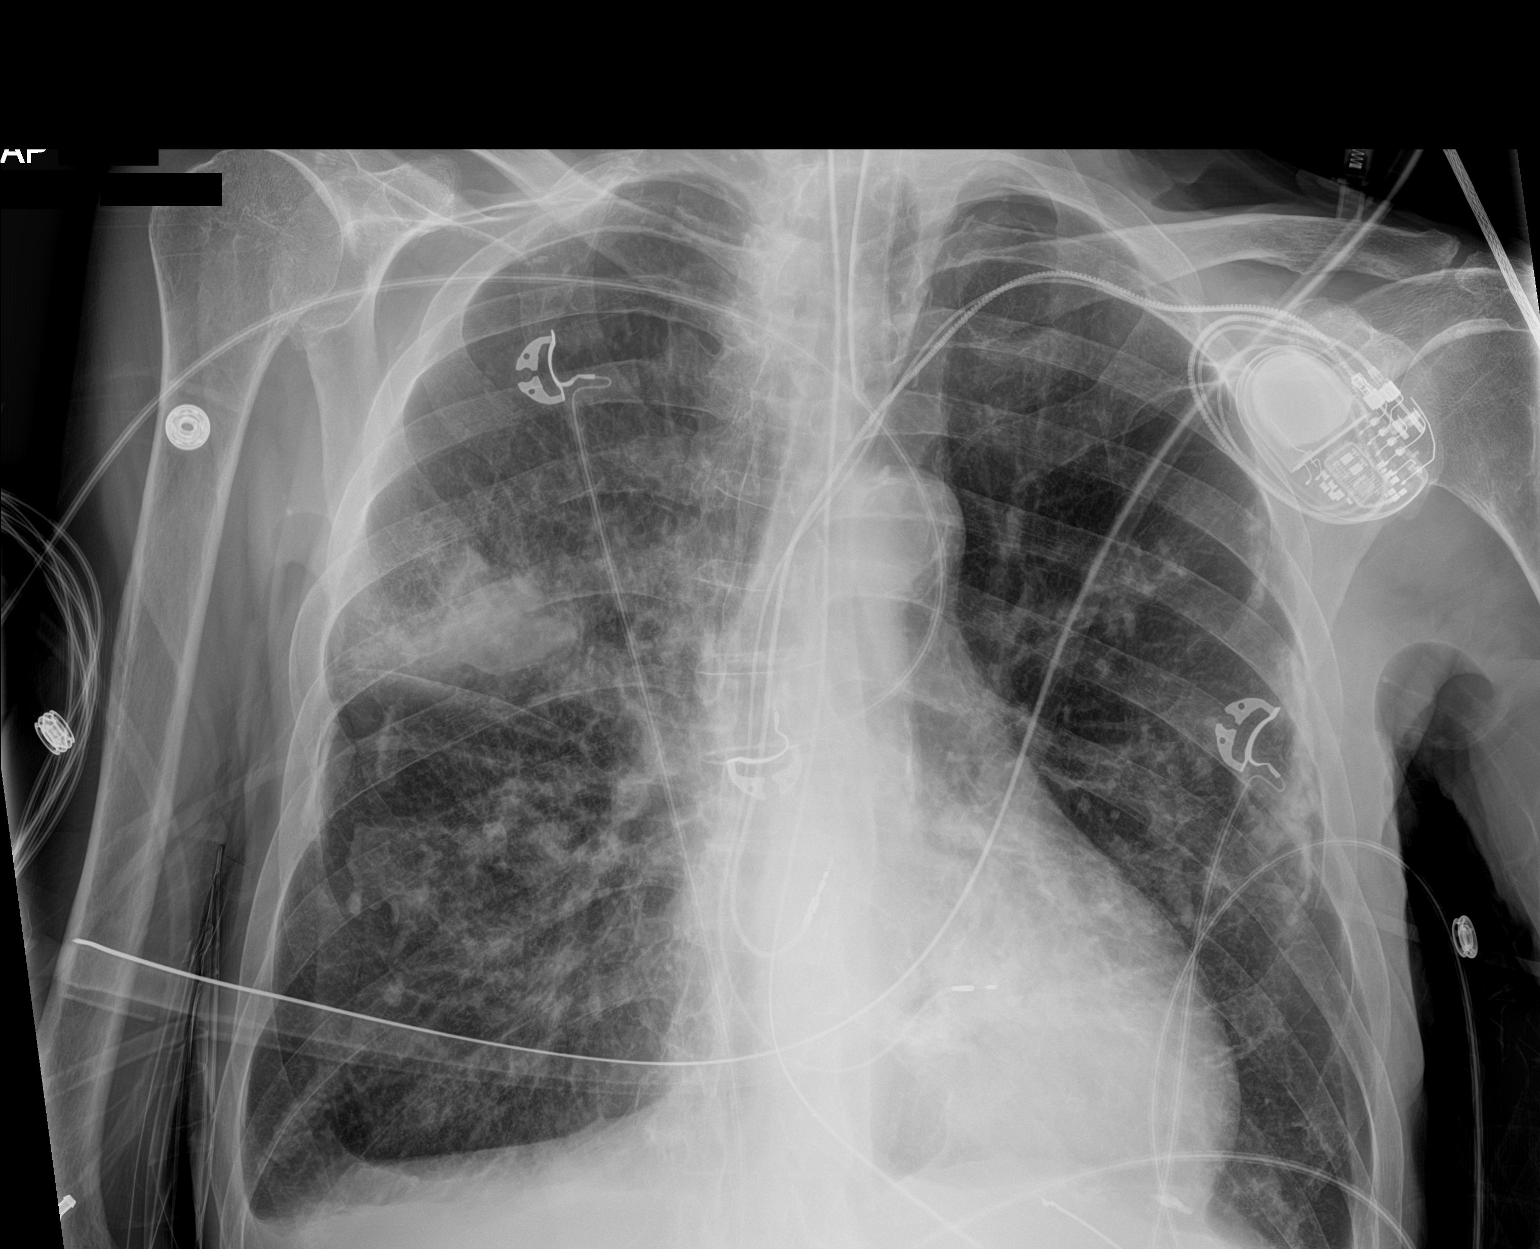

[chest ap (2 of 2)]
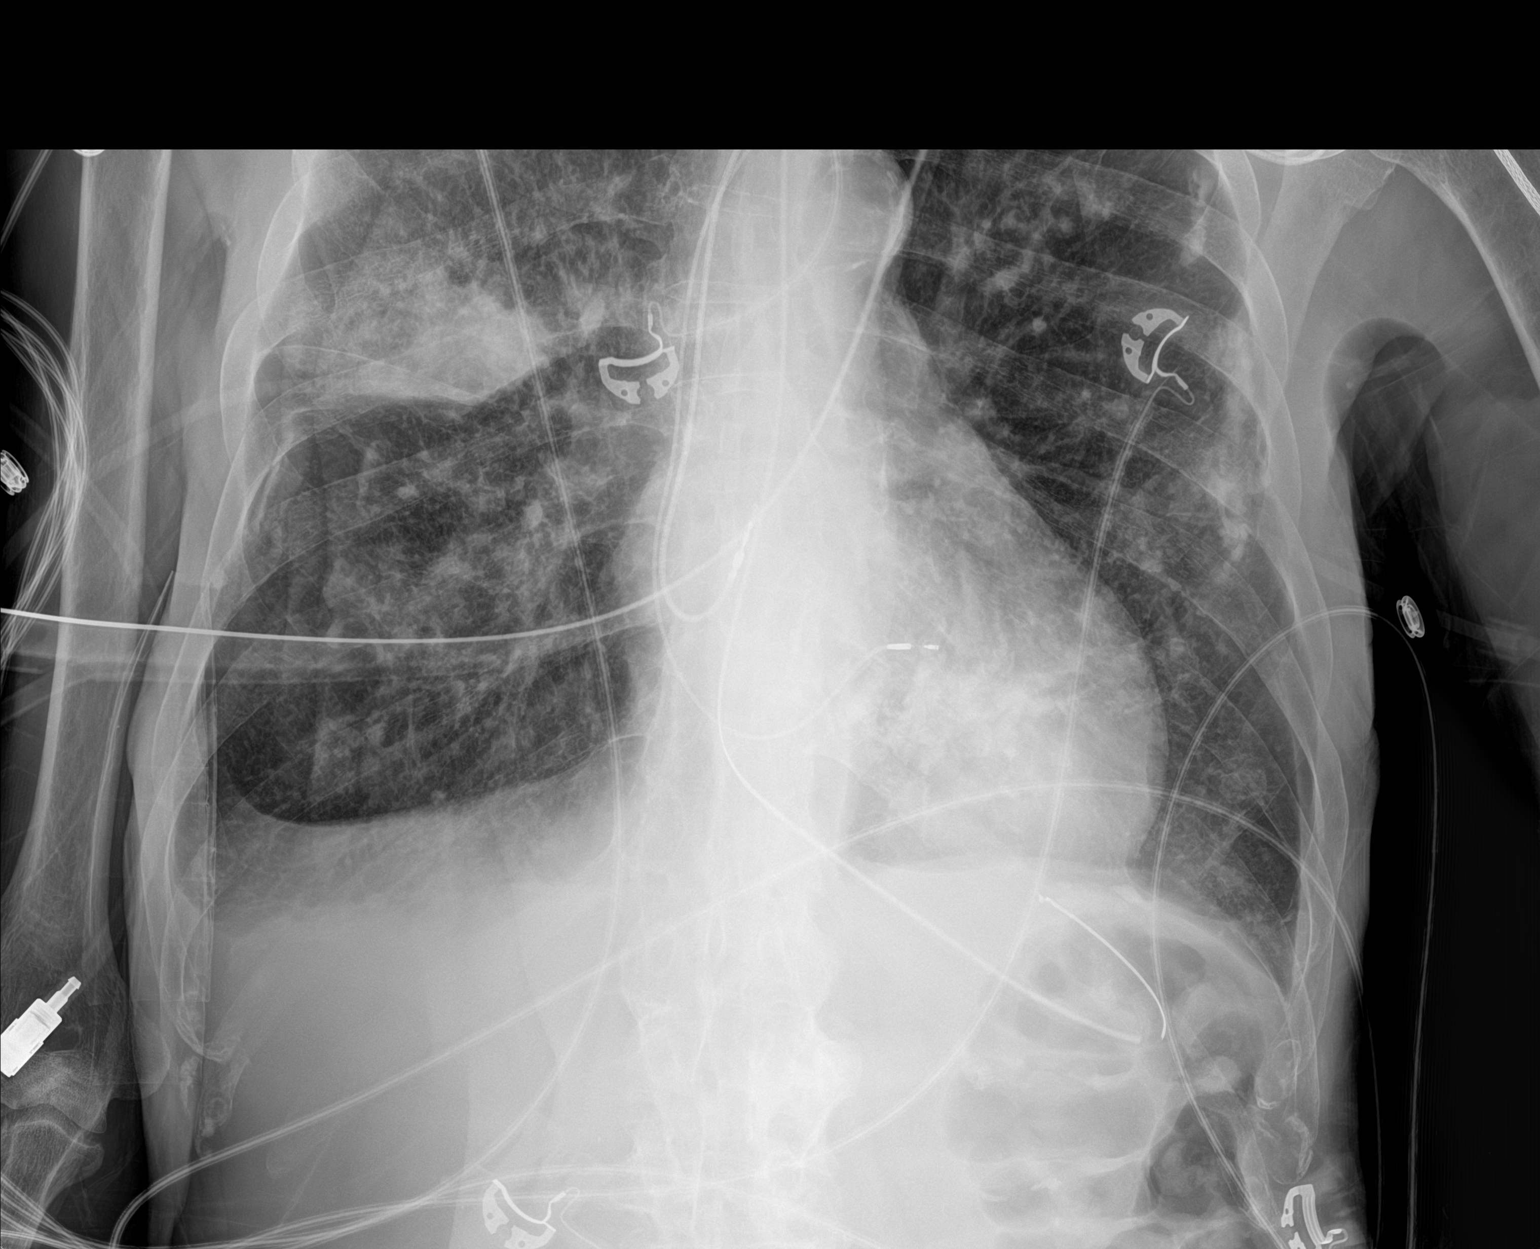

[2 of 2 positions shown; findings below may reference images not displayed]

FINDINGS: Endotracheal tube remains in the mid trachea, 6.7 cm from the
carina. Transesophageal tube tip and side port distal to the GE
junction. Pacer pack overlies the left chest wall with leads at the
cardiac apex and right atrium. Persistent bilateral interstitial and
airspace opacities with some loculated fluid in the right minor
fissure. Suspect small basilar effusions as well. Some calcified
pleural plaque is noted.
IMPRESSION: 1. Lines and tubes in stable, satisfactory position.
2. Persistent bilateral interstitial and airspace opacities with
some loculated fluid in the right minor fissure and bilateral
effusions.
